# Patient Record
Sex: Female | Born: 1950
Health system: Southern US, Community
[De-identification: ages and names within clinical notes are randomized; demographics above are authoritative.]

## PROBLEM LIST (undated history)

## (undated) DIAGNOSIS — F419 Anxiety disorder, unspecified: Secondary | ICD-10-CM

## (undated) DIAGNOSIS — K469 Unspecified abdominal hernia without obstruction or gangrene: Secondary | ICD-10-CM

## (undated) DIAGNOSIS — M1990 Unspecified osteoarthritis, unspecified site: Secondary | ICD-10-CM

## (undated) DIAGNOSIS — J45909 Unspecified asthma, uncomplicated: Secondary | ICD-10-CM

## (undated) DIAGNOSIS — I10 Essential (primary) hypertension: Secondary | ICD-10-CM

## (undated) DIAGNOSIS — K219 Gastro-esophageal reflux disease without esophagitis: Secondary | ICD-10-CM

## (undated) DIAGNOSIS — F319 Bipolar disorder, unspecified: Secondary | ICD-10-CM

## (undated) DIAGNOSIS — Z86718 Personal history of other venous thrombosis and embolism: Secondary | ICD-10-CM

## (undated) DIAGNOSIS — Z87442 Personal history of urinary calculi: Secondary | ICD-10-CM

## (undated) DIAGNOSIS — IMO0001 Reserved for inherently not codable concepts without codable children: Secondary | ICD-10-CM

## (undated) HISTORY — PX: KNEE ARTHROPLASTY: SHX992

## (undated) HISTORY — DX: Unspecified abdominal hernia without obstruction or gangrene: K46.9

## (undated) HISTORY — PX: CHOLECYSTECTOMY: SHX55

## (undated) HISTORY — DX: Anxiety disorder, unspecified: F41.9

## (undated) HISTORY — DX: Unspecified asthma, uncomplicated: J45.909

---

## 2000-09-22 ENCOUNTER — Emergency Department (HOSPITAL_COMMUNITY): Admission: EM | Admit: 2000-09-22 | Discharge: 2000-09-22 | Payer: Self-pay | Admitting: *Deleted

## 2000-09-22 ENCOUNTER — Encounter: Payer: Self-pay | Admitting: Emergency Medicine

## 2000-09-23 ENCOUNTER — Ambulatory Visit (HOSPITAL_COMMUNITY): Admission: RE | Admit: 2000-09-23 | Discharge: 2000-09-23 | Payer: Self-pay | Admitting: Urology

## 2000-09-23 ENCOUNTER — Encounter: Payer: Self-pay | Admitting: Urology

## 2000-10-15 ENCOUNTER — Encounter: Payer: Self-pay | Admitting: Urology

## 2000-10-15 ENCOUNTER — Ambulatory Visit (HOSPITAL_COMMUNITY): Admission: RE | Admit: 2000-10-15 | Discharge: 2000-10-15 | Payer: Self-pay | Admitting: Urology

## 2000-10-21 ENCOUNTER — Encounter: Payer: Self-pay | Admitting: Urology

## 2000-10-21 ENCOUNTER — Ambulatory Visit (HOSPITAL_COMMUNITY): Admission: RE | Admit: 2000-10-21 | Discharge: 2000-10-21 | Payer: Self-pay | Admitting: Pulmonary Disease

## 2000-10-21 ENCOUNTER — Ambulatory Visit (HOSPITAL_COMMUNITY): Admission: RE | Admit: 2000-10-21 | Discharge: 2000-10-21 | Payer: Self-pay | Admitting: Urology

## 2000-11-07 ENCOUNTER — Encounter: Payer: Self-pay | Admitting: Urology

## 2000-11-07 ENCOUNTER — Ambulatory Visit (HOSPITAL_COMMUNITY): Admission: RE | Admit: 2000-11-07 | Discharge: 2000-11-07 | Payer: Self-pay | Admitting: Urology

## 2006-09-12 ENCOUNTER — Ambulatory Visit (HOSPITAL_COMMUNITY): Admission: RE | Admit: 2006-09-12 | Discharge: 2006-09-12 | Payer: Self-pay | Admitting: Pulmonary Disease

## 2008-09-01 ENCOUNTER — Ambulatory Visit (HOSPITAL_COMMUNITY): Admission: RE | Admit: 2008-09-01 | Discharge: 2008-09-01 | Payer: Self-pay | Admitting: Pulmonary Disease

## 2008-10-06 ENCOUNTER — Encounter: Admission: RE | Admit: 2008-10-06 | Discharge: 2008-10-06 | Payer: Self-pay | Admitting: Orthopedic Surgery

## 2008-10-20 ENCOUNTER — Encounter (HOSPITAL_COMMUNITY): Admission: RE | Admit: 2008-10-20 | Discharge: 2008-10-28 | Payer: Self-pay | Admitting: Orthopedic Surgery

## 2008-10-29 ENCOUNTER — Encounter (HOSPITAL_COMMUNITY): Admission: RE | Admit: 2008-10-29 | Discharge: 2008-11-28 | Payer: Self-pay | Admitting: Orthopedic Surgery

## 2008-11-25 ENCOUNTER — Inpatient Hospital Stay (HOSPITAL_COMMUNITY): Admission: RE | Admit: 2008-11-25 | Discharge: 2008-11-28 | Payer: Self-pay | Admitting: Orthopedic Surgery

## 2008-12-28 ENCOUNTER — Encounter (HOSPITAL_COMMUNITY): Admission: RE | Admit: 2008-12-28 | Discharge: 2009-01-26 | Payer: Self-pay | Admitting: Orthopedic Surgery

## 2009-10-01 ENCOUNTER — Encounter (INDEPENDENT_AMBULATORY_CARE_PROVIDER_SITE_OTHER): Payer: Self-pay | Admitting: Internal Medicine

## 2009-10-01 ENCOUNTER — Encounter: Payer: Self-pay | Admitting: Emergency Medicine

## 2009-10-01 ENCOUNTER — Inpatient Hospital Stay (HOSPITAL_COMMUNITY): Admission: EM | Admit: 2009-10-01 | Discharge: 2009-10-05 | Payer: Self-pay | Admitting: Internal Medicine

## 2009-10-21 ENCOUNTER — Inpatient Hospital Stay (HOSPITAL_COMMUNITY): Admission: EM | Admit: 2009-10-21 | Discharge: 2009-10-24 | Payer: Self-pay | Admitting: Emergency Medicine

## 2009-12-13 ENCOUNTER — Encounter (INDEPENDENT_AMBULATORY_CARE_PROVIDER_SITE_OTHER): Payer: Self-pay | Admitting: Family Medicine

## 2009-12-13 ENCOUNTER — Ambulatory Visit: Admission: RE | Admit: 2009-12-13 | Discharge: 2009-12-13 | Payer: Self-pay | Admitting: Family Medicine

## 2009-12-13 ENCOUNTER — Ambulatory Visit: Payer: Self-pay | Admitting: Vascular Surgery

## 2010-04-13 LAB — URINE MICROSCOPIC-ADD ON

## 2010-04-13 LAB — DIFFERENTIAL
Band Neutrophils: 0 % (ref 0–10)
Basophils Absolute: 0 10*3/uL (ref 0.0–0.1)
Basophils Absolute: 0 10*3/uL (ref 0.0–0.1)
Basophils Absolute: 0 10*3/uL (ref 0.0–0.1)
Basophils Relative: 1 % (ref 0–1)
Basophils Relative: 1 % (ref 0–1)
Basophils Relative: 0 % (ref 0–1)
Blasts: 0 %
Eosinophils Absolute: 0.2 10*3/uL (ref 0.0–0.7)
Eosinophils Absolute: 0.2 10*3/uL (ref 0.0–0.7)
Eosinophils Absolute: 0.1 10*3/uL (ref 0.0–0.7)
Eosinophils Relative: 3 % (ref 0–5)
Eosinophils Relative: 4 % (ref 0–5)
Eosinophils Relative: 1 % (ref 0–5)
Lymphocytes Relative: 17 % (ref 12–46)
Lymphocytes Relative: 27 % (ref 12–46)
Lymphocytes Relative: 28 % (ref 12–46)
Lymphs Abs: 1 10*3/uL (ref 0.7–4.0)
Lymphs Abs: 1.2 10*3/uL (ref 0.7–4.0)
Lymphs Abs: 1.6 10*3/uL (ref 0.7–4.0)
Metamyelocytes Relative: 0 %
Monocytes Absolute: 0.6 10*3/uL (ref 0.1–1.0)
Monocytes Absolute: 0.9 10*3/uL (ref 0.1–1.0)
Monocytes Absolute: 0.4 10*3/uL (ref 0.1–1.0)
Monocytes Relative: 14 % — ABNORMAL HIGH (ref 3–12)
Monocytes Relative: 15 % — ABNORMAL HIGH (ref 3–12)
Monocytes Relative: 7 % (ref 3–12)
Myelocytes: 0 %
Neutro Abs: 2.4 10*3/uL (ref 1.7–7.7)
Neutro Abs: 3.9 10*3/uL (ref 1.7–7.7)
Neutro Abs: 3.6 10*3/uL (ref 1.7–7.7)
Neutrophils Relative %: 55 % (ref 43–77)
Neutrophils Relative %: 65 % (ref 43–77)
Neutrophils Relative %: 64 % (ref 43–77)
Promyelocytes Absolute: 0 %
nRBC: 0 /100 WBC

## 2010-04-13 LAB — URINE CULTURE
Colony Count: >=100000
Colony Count: NO GROWTH
Colony Count: NO GROWTH
Culture  Setup Time: 201109232146
Culture  Setup Time: 201109242137
Culture  Setup Time: 201109041144
Culture: NO GROWTH
Culture: NO GROWTH
Special Requests: POSITIVE
Special Requests: NEGATIVE

## 2010-04-13 LAB — RAPID URINE DRUG SCREEN, HOSP PERFORMED
Amphetamines: NOT DETECTED
Amphetamines: NOT DETECTED
Barbiturates: NOT DETECTED
Barbiturates: NOT DETECTED
Benzodiazepines: NOT DETECTED
Benzodiazepines: NOT DETECTED
Cocaine: NOT DETECTED
Cocaine: NOT DETECTED
Opiates: NOT DETECTED
Opiates: NOT DETECTED
Tetrahydrocannabinol: NOT DETECTED
Tetrahydrocannabinol: NOT DETECTED

## 2010-04-13 LAB — BASIC METABOLIC PANEL
BUN: 5 mg/dL — ABNORMAL LOW (ref 6–23)
BUN: 8 mg/dL (ref 6–23)
BUN: 11 mg/dL (ref 6–23)
BUN: 13 mg/dL (ref 6–23)
BUN: 16 mg/dL (ref 6–23)
CO2: 27 mEq/L (ref 19–32)
CO2: 28 mEq/L (ref 19–32)
CO2: 26 mEq/L (ref 19–32)
CO2: 22 mEq/L (ref 19–32)
CO2: 24 mEq/L (ref 19–32)
Calcium: 8.3 mg/dL — ABNORMAL LOW (ref 8.4–10.5)
Calcium: 8.4 mg/dL (ref 8.4–10.5)
Calcium: 8.5 mg/dL (ref 8.4–10.5)
Calcium: 8.7 mg/dL (ref 8.4–10.5)
Calcium: 8.7 mg/dL (ref 8.4–10.5)
Chloride: 109 mEq/L (ref 96–112)
Chloride: 110 mEq/L (ref 96–112)
Chloride: 107 mEq/L (ref 96–112)
Chloride: 107 mEq/L (ref 96–112)
Chloride: 108 mEq/L (ref 96–112)
Creatinine, Ser: 0.6 mg/dL (ref 0.4–1.2)
Creatinine, Ser: 0.64 mg/dL (ref 0.4–1.2)
Creatinine, Ser: 0.91 mg/dL (ref 0.4–1.2)
Creatinine, Ser: 0.76 mg/dL (ref 0.4–1.2)
Creatinine, Ser: 0.81 mg/dL (ref 0.4–1.2)
GFR calc Af Amer: >60 mL/min (ref >60)
GFR calc Af Amer: >60 mL/min (ref >60)
GFR calc Af Amer: >60 mL/min (ref >60)
GFR calc Af Amer: >60 mL/min (ref >60)
GFR calc Af Amer: >60 mL/min (ref >60)
GFR calc non Af Amer: >60 mL/min (ref >60)
GFR calc non Af Amer: >60 mL/min (ref >60)
GFR calc non Af Amer: >60 mL/min (ref >60)
GFR calc non Af Amer: >60 mL/min (ref >60)
GFR calc non Af Amer: >60 mL/min (ref >60)
Glucose, Bld: 105 mg/dL — ABNORMAL HIGH (ref 70–99)
Glucose, Bld: 121 mg/dL — ABNORMAL HIGH (ref 70–99)
Glucose, Bld: 124 mg/dL — ABNORMAL HIGH (ref 70–99)
Glucose, Bld: 105 mg/dL — ABNORMAL HIGH (ref 70–99)
Glucose, Bld: 144 mg/dL — ABNORMAL HIGH (ref 70–99)
Potassium: 3.1 mEq/L — ABNORMAL LOW (ref 3.5–5.1)
Potassium: 3.5 mEq/L (ref 3.5–5.1)
Potassium: 3.4 mEq/L — ABNORMAL LOW (ref 3.5–5.1)
Potassium: 3.6 mEq/L (ref 3.5–5.1)
Potassium: 4.1 mEq/L (ref 3.5–5.1)
Sodium: 142 mEq/L (ref 135–145)
Sodium: 143 mEq/L (ref 135–145)
Sodium: 142 mEq/L (ref 135–145)
Sodium: 139 mEq/L (ref 135–145)
Sodium: 140 mEq/L (ref 135–145)

## 2010-04-13 LAB — CBC
HCT: 32.8 % — ABNORMAL LOW (ref 36.0–46.0)
HCT: 35.2 % — ABNORMAL LOW (ref 36.0–46.0)
HCT: 35.6 % — ABNORMAL LOW (ref 36.0–46.0)
HCT: 32.1 % — ABNORMAL LOW (ref 36.0–46.0)
HCT: 33.7 % — ABNORMAL LOW (ref 36.0–46.0)
HCT: 34.8 % — ABNORMAL LOW (ref 36.0–46.0)
HCT: 36.3 % (ref 36.0–46.0)
Hemoglobin: 10.7 g/dL — ABNORMAL LOW (ref 12.0–15.0)
Hemoglobin: 11.5 g/dL — ABNORMAL LOW (ref 12.0–15.0)
Hemoglobin: 11.6 g/dL — ABNORMAL LOW (ref 12.0–15.0)
Hemoglobin: 10.3 g/dL — ABNORMAL LOW (ref 12.0–15.0)
Hemoglobin: 10.6 g/dL — ABNORMAL LOW (ref 12.0–15.0)
Hemoglobin: 11.1 g/dL — ABNORMAL LOW (ref 12.0–15.0)
Hemoglobin: 11.4 g/dL — ABNORMAL LOW (ref 12.0–15.0)
MCH: 30.2 pg (ref 26.0–34.0)
MCH: 30.3 pg (ref 26.0–34.0)
MCH: 30.2 pg (ref 26.0–34.0)
MCH: 28.8 pg (ref 26.0–34.0)
MCH: 29.2 pg (ref 26.0–34.0)
MCH: 29.3 pg (ref 26.0–34.0)
MCH: 29.6 pg (ref 26.0–34.0)
MCHC: 32.5 g/dL (ref 30.0–36.0)
MCHC: 32.6 g/dL (ref 30.0–36.0)
MCHC: 32.7 g/dL (ref 30.0–36.0)
MCHC: 31.4 g/dL (ref 30.0–36.0)
MCHC: 31.5 g/dL (ref 30.0–36.0)
MCHC: 31.9 g/dL (ref 30.0–36.0)
MCHC: 32.1 g/dL (ref 30.0–36.0)
MCV: 92.9 fL (ref 78.0–100.0)
MCV: 92.9 fL (ref 78.0–100.0)
MCV: 92.2 fL (ref 78.0–100.0)
MCV: 90.9 fL (ref 78.0–100.0)
MCV: 91.6 fL (ref 78.0–100.0)
MCV: 92.8 fL (ref 78.0–100.0)
MCV: 93.3 fL (ref 78.0–100.0)
Platelets: 159 10*3/uL (ref 150–400)
Platelets: 161 10*3/uL (ref 150–400)
Platelets: 207 10*3/uL (ref 150–400)
Platelets: 227 10*3/uL (ref 150–400)
Platelets: 257 10*3/uL (ref 150–400)
Platelets: 277 10*3/uL (ref 150–400)
Platelets: 321 10*3/uL (ref 150–400)
RBC: 3.53 MIL/uL — ABNORMAL LOW (ref 3.87–5.11)
RBC: 3.79 MIL/uL — ABNORMAL LOW (ref 3.87–5.11)
RBC: 3.86 MIL/uL — ABNORMAL LOW (ref 3.87–5.11)
RBC: 3.53 MIL/uL — ABNORMAL LOW (ref 3.87–5.11)
RBC: 3.68 MIL/uL — ABNORMAL LOW (ref 3.87–5.11)
RBC: 3.75 MIL/uL — ABNORMAL LOW (ref 3.87–5.11)
RBC: 3.89 MIL/uL (ref 3.87–5.11)
RDW: 14.8 % (ref 11.5–15.5)
RDW: 14.8 % (ref 11.5–15.5)
RDW: 14.4 % (ref 11.5–15.5)
RDW: 14 % (ref 11.5–15.5)
RDW: 14 % (ref 11.5–15.5)
RDW: 14.1 % (ref 11.5–15.5)
RDW: 14.2 % (ref 11.5–15.5)
WBC: 4.4 10*3/uL (ref 4.0–10.5)
WBC: 5.9 10*3/uL (ref 4.0–10.5)
WBC: 5.7 10*3/uL (ref 4.0–10.5)
WBC: 5.6 10*3/uL (ref 4.0–10.5)
WBC: 7.1 10*3/uL (ref 4.0–10.5)
WBC: 7.3 10*3/uL (ref 4.0–10.5)
WBC: 8 10*3/uL (ref 4.0–10.5)

## 2010-04-13 LAB — COMPREHENSIVE METABOLIC PANEL
ALT: 27 U/L (ref 0–35)
ALT: 14 U/L (ref 0–35)
AST: 25 U/L (ref 0–37)
AST: 18 U/L (ref 0–37)
Albumin: 3.6 g/dL (ref 3.5–5.2)
Albumin: 2.9 g/dL — ABNORMAL LOW (ref 3.5–5.2)
Alkaline Phosphatase: 55 U/L (ref 39–117)
Alkaline Phosphatase: 50 U/L (ref 39–117)
BUN: 10 mg/dL (ref 6–23)
BUN: 13 mg/dL (ref 6–23)
CO2: 30 mEq/L (ref 19–32)
CO2: 24 mEq/L (ref 19–32)
Calcium: 8.8 mg/dL (ref 8.4–10.5)
Calcium: 8.7 mg/dL (ref 8.4–10.5)
Chloride: 100 mEq/L (ref 96–112)
Chloride: 110 mEq/L (ref 96–112)
Creatinine, Ser: 0.76 mg/dL (ref 0.4–1.2)
Creatinine, Ser: 0.74 mg/dL (ref 0.4–1.2)
GFR calc Af Amer: >60 mL/min (ref >60)
GFR calc Af Amer: >60 mL/min (ref >60)
GFR calc non Af Amer: >60 mL/min (ref >60)
GFR calc non Af Amer: >60 mL/min (ref >60)
Glucose, Bld: 108 mg/dL — ABNORMAL HIGH (ref 70–99)
Glucose, Bld: 114 mg/dL — ABNORMAL HIGH (ref 70–99)
Potassium: 3.5 mEq/L (ref 3.5–5.1)
Potassium: 3.4 mEq/L — ABNORMAL LOW (ref 3.5–5.1)
Sodium: 138 mEq/L (ref 135–145)
Sodium: 142 mEq/L (ref 135–145)
Total Bilirubin: 0.7 mg/dL (ref 0.3–1.2)
Total Bilirubin: 0.5 mg/dL (ref 0.3–1.2)
Total Protein: 6.6 g/dL (ref 6.0–8.3)
Total Protein: 6.5 g/dL (ref 6.0–8.3)

## 2010-04-13 LAB — PROTIME-INR
INR: 2.18 — ABNORMAL HIGH (ref 0.00–1.49)
INR: 2.23 — ABNORMAL HIGH (ref 0.00–1.49)
INR: 2.32 — ABNORMAL HIGH (ref 0.00–1.49)
INR: 2.33 — ABNORMAL HIGH (ref 0.00–1.49)
INR: 1.16 (ref 0.00–1.49)
INR: 1.19 (ref 0.00–1.49)
INR: 1.3 (ref 0.00–1.49)
INR: 1.83 — ABNORMAL HIGH (ref 0.00–1.49)
INR: 2.2 — ABNORMAL HIGH (ref 0.00–1.49)
Prothrombin Time: 24.4 seconds — ABNORMAL HIGH (ref 11.6–15.2)
Prothrombin Time: 24.8 seconds — ABNORMAL HIGH (ref 11.6–15.2)
Prothrombin Time: 25.6 seconds — ABNORMAL HIGH (ref 11.6–15.2)
Prothrombin Time: 25.7 seconds — ABNORMAL HIGH (ref 11.6–15.2)
Prothrombin Time: 15 seconds (ref 11.6–15.2)
Prothrombin Time: 15.3 seconds — ABNORMAL HIGH (ref 11.6–15.2)
Prothrombin Time: 16.4 seconds — ABNORMAL HIGH (ref 11.6–15.2)
Prothrombin Time: 21.3 seconds — ABNORMAL HIGH (ref 11.6–15.2)
Prothrombin Time: 24.6 seconds — ABNORMAL HIGH (ref 11.6–15.2)

## 2010-04-13 LAB — LIPID PANEL
Cholesterol: 165 mg/dL (ref 0–200)
Cholesterol: 161 mg/dL (ref 0–200)
HDL: 46 mg/dL (ref >39)
HDL: 37 mg/dL — ABNORMAL LOW (ref >39)
LDL Cholesterol: 99 mg/dL (ref 0–99)
LDL Cholesterol: 106 mg/dL — ABNORMAL HIGH (ref 0–99)
Total CHOL/HDL Ratio: 3.6 RATIO
Total CHOL/HDL Ratio: 4.4 RATIO
Triglycerides: 100 mg/dL (ref <150)
Triglycerides: 88 mg/dL (ref <150)
VLDL: 20 mg/dL (ref 0–40)
VLDL: 18 mg/dL (ref 0–40)

## 2010-04-13 LAB — CK TOTAL AND CKMB (NOT AT ARMC)
CK, MB: 3.3 ng/mL (ref 0.3–4.0)
Relative Index: 4.6 — ABNORMAL HIGH (ref 0.0–2.5)
Total CK: 132 U/L (ref 7–177)

## 2010-04-13 LAB — BLOOD GAS, ARTERIAL
Acid-Base Excess: 3.5 mmol/L — ABNORMAL HIGH (ref 0.0–2.0)
Bicarbonate: 27.3 mEq/L — ABNORMAL HIGH (ref 20.0–24.0)
FIO2: 21 %
O2 Saturation: 96.2 %
Patient temperature: 37
TCO2: 24.8 mmol/L (ref 0–100)
pCO2 arterial: 40.4 mmHg (ref 35.0–45.0)
pH, Arterial: 7.445 — ABNORMAL HIGH (ref 7.350–7.400)
pO2, Arterial: 75.4 mmHg — ABNORMAL LOW (ref 80.0–100.0)

## 2010-04-13 LAB — URINALYSIS, ROUTINE W REFLEX MICROSCOPIC
Bilirubin Urine: NEGATIVE
Bilirubin Urine: NEGATIVE
Glucose, UA: NEGATIVE mg/dL
Glucose, UA: NEGATIVE mg/dL
Glucose, UA: NEGATIVE mg/dL
Ketones, ur: NEGATIVE mg/dL
Ketones, ur: NEGATIVE mg/dL
Ketones, ur: NEGATIVE mg/dL
Leukocytes, UA: NEGATIVE
Leukocytes, UA: NEGATIVE
Nitrite: NEGATIVE
Nitrite: NEGATIVE
Nitrite: NEGATIVE
Protein, ur: NEGATIVE mg/dL
Protein, ur: NEGATIVE mg/dL
Protein, ur: 30 mg/dL — AB
Specific Gravity, Urine: 1.01 (ref 1.005–1.030)
Specific Gravity, Urine: <1.005 — ABNORMAL LOW (ref 1.005–1.030)
Specific Gravity, Urine: 1.01 (ref 1.005–1.030)
Urobilinogen, UA: 0.2 mg/dL (ref 0.0–1.0)
Urobilinogen, UA: 0.2 mg/dL (ref 0.0–1.0)
Urobilinogen, UA: 1 mg/dL (ref 0.0–1.0)
pH: 6.5 (ref 5.0–8.0)
pH: 7 (ref 5.0–8.0)
pH: 6 (ref 5.0–8.0)

## 2010-04-13 LAB — GLUCOSE, CAPILLARY
Glucose-Capillary: 125 mg/dL — ABNORMAL HIGH (ref 70–99)
Glucose-Capillary: 108 mg/dL — ABNORMAL HIGH (ref 70–99)
Glucose-Capillary: 109 mg/dL — ABNORMAL HIGH (ref 70–99)
Glucose-Capillary: 115 mg/dL — ABNORMAL HIGH (ref 70–99)
Glucose-Capillary: 123 mg/dL — ABNORMAL HIGH (ref 70–99)
Glucose-Capillary: 126 mg/dL — ABNORMAL HIGH (ref 70–99)
Glucose-Capillary: 153 mg/dL — ABNORMAL HIGH (ref 70–99)
Glucose-Capillary: 192 mg/dL — ABNORMAL HIGH (ref 70–99)
Glucose-Capillary: 96 mg/dL (ref 70–99)

## 2010-04-13 LAB — CARDIAC PANEL(CRET KIN+CKTOT+MB+TROPI)
CK, MB: 2 ng/mL (ref 0.3–4.0)
CK, MB: 2.4 ng/mL (ref 0.3–4.0)
CK, MB: 2.5 ng/mL (ref 0.3–4.0)
CK, MB: 2.6 ng/mL (ref 0.3–4.0)
Relative Index: 1.7 (ref 0.0–2.5)
Relative Index: 1.8 (ref 0.0–2.5)
Relative Index: 1.9 (ref 0.0–2.5)
Relative Index: 2 (ref 0.0–2.5)
Total CK: 118 U/L (ref 7–177)
Total CK: 125 U/L (ref 7–177)
Total CK: 130 U/L (ref 7–177)
Total CK: 139 U/L (ref 7–177)
Troponin I: 0.01 ng/mL (ref 0.00–0.06)
Troponin I: 0.04 ng/mL (ref 0.00–0.06)
Troponin I: 0.06 ng/mL (ref 0.00–0.06)
Troponin I: 0.08 ng/mL — ABNORMAL HIGH (ref 0.00–0.06)

## 2010-04-13 LAB — TROPONIN I: Troponin I: 0.1 ng/mL — ABNORMAL HIGH (ref 0.00–0.06)

## 2010-04-13 LAB — HEMOGLOBIN A1C
Hgb A1c MFr Bld: 6.4 % — ABNORMAL HIGH (ref <5.7)
Hgb A1c MFr Bld: 5.9 % — ABNORMAL HIGH (ref <5.7)
Mean Plasma Glucose: 137 mg/dL — ABNORMAL HIGH (ref <117)
Mean Plasma Glucose: 123 mg/dL — ABNORMAL HIGH (ref <117)

## 2010-04-13 LAB — ETHANOL: Alcohol, Ethyl (B): <5 mg/dL (ref 0–10)

## 2010-04-13 LAB — HOMOCYSTEINE: Homocysteine: 9.5 umol/L (ref 4.0–15.4)

## 2010-04-13 LAB — LACTIC ACID, PLASMA: Lactic Acid, Venous: 1.2 mmol/L (ref 0.5–2.2)

## 2010-04-13 LAB — BRAIN NATRIURETIC PEPTIDE
Pro B Natriuretic peptide (BNP): 442 pg/mL — ABNORMAL HIGH (ref 0.0–100.0)
Pro B Natriuretic peptide (BNP): 290 pg/mL — ABNORMAL HIGH (ref 0.0–100.0)

## 2010-04-13 LAB — APTT
aPTT: 32 seconds (ref 24–37)
aPTT: 36 seconds (ref 24–37)
aPTT: 39 seconds — ABNORMAL HIGH (ref 24–37)
aPTT: 33 seconds (ref 24–37)

## 2010-04-13 LAB — PROCALCITONIN: Procalcitonin: <0.1 ng/mL

## 2010-04-13 LAB — AMMONIA: Ammonia: <8 umol/L — ABNORMAL LOW (ref 11–35)

## 2010-04-13 LAB — HEPARIN LEVEL (UNFRACTIONATED)
Heparin Unfractionated: 0.63 IU/mL (ref 0.30–0.70)
Heparin Unfractionated: 1.01 IU/mL — ABNORMAL HIGH (ref 0.30–0.70)
Heparin Unfractionated: 1.08 IU/mL — ABNORMAL HIGH (ref 0.30–0.70)

## 2010-04-13 LAB — MRSA PCR SCREENING: MRSA by PCR: NEGATIVE

## 2010-05-04 LAB — CBC
HCT: 28.3 % — ABNORMAL LOW (ref 36.0–46.0)
HCT: 29 % — ABNORMAL LOW (ref 36.0–46.0)
HCT: 36.8 % (ref 36.0–46.0)
Hemoglobin: 12.3 g/dL (ref 12.0–15.0)
Hemoglobin: 9.4 g/dL — ABNORMAL LOW (ref 12.0–15.0)
Hemoglobin: 9.8 g/dL — ABNORMAL LOW (ref 12.0–15.0)
MCHC: 33.3 g/dL (ref 30.0–36.0)
MCHC: 33.3 g/dL (ref 30.0–36.0)
MCHC: 33.8 g/dL (ref 30.0–36.0)
MCV: 93.8 fL (ref 78.0–100.0)
MCV: 94.1 fL (ref 78.0–100.0)
MCV: 94.3 fL (ref 78.0–100.0)
Platelets: 143 10*3/uL — ABNORMAL LOW (ref 150–400)
Platelets: 154 10*3/uL (ref 150–400)
Platelets: 242 10*3/uL (ref 150–400)
RBC: 3 MIL/uL — ABNORMAL LOW (ref 3.87–5.11)
RBC: 3.08 MIL/uL — ABNORMAL LOW (ref 3.87–5.11)
RBC: 3.93 MIL/uL (ref 3.87–5.11)
RDW: 14 % (ref 11.5–15.5)
RDW: 14.1 % (ref 11.5–15.5)
RDW: 14.3 % (ref 11.5–15.5)
WBC: 10.2 10*3/uL (ref 4.0–10.5)
WBC: 11.7 10*3/uL — ABNORMAL HIGH (ref 4.0–10.5)
WBC: 8.8 10*3/uL (ref 4.0–10.5)

## 2010-05-04 LAB — COMPREHENSIVE METABOLIC PANEL
ALT: 15 U/L (ref 0–35)
AST: 15 U/L (ref 0–37)
Albumin: 3.8 g/dL (ref 3.5–5.2)
Alkaline Phosphatase: 69 U/L (ref 39–117)
BUN: 18 mg/dL (ref 6–23)
CO2: 24 mEq/L (ref 19–32)
Calcium: 8.9 mg/dL (ref 8.4–10.5)
Chloride: 106 mEq/L (ref 96–112)
Creatinine, Ser: 0.97 mg/dL (ref 0.4–1.2)
GFR calc Af Amer: >60 mL/min (ref >60)
GFR calc non Af Amer: 59 mL/min — ABNORMAL LOW (ref >60)
Glucose, Bld: 90 mg/dL (ref 70–99)
Potassium: 4 mEq/L (ref 3.5–5.1)
Sodium: 137 mEq/L (ref 135–145)
Total Bilirubin: 0.4 mg/dL (ref 0.3–1.2)
Total Protein: 7.2 g/dL (ref 6.0–8.3)

## 2010-05-04 LAB — URINALYSIS, ROUTINE W REFLEX MICROSCOPIC
Glucose, UA: NEGATIVE mg/dL
Hgb urine dipstick: NEGATIVE
Ketones, ur: NEGATIVE mg/dL
Nitrite: NEGATIVE
Protein, ur: NEGATIVE mg/dL
Specific Gravity, Urine: 1.013 (ref 1.005–1.030)
Urobilinogen, UA: 0.2 mg/dL (ref 0.0–1.0)
pH: 5.5 (ref 5.0–8.0)

## 2010-05-04 LAB — APTT: aPTT: 22 seconds — ABNORMAL LOW (ref 24–37)

## 2010-05-04 LAB — TYPE AND SCREEN
ABO/RH(D): O POS
Antibody Screen: NEGATIVE

## 2010-05-04 LAB — ABO/RH: ABO/RH(D): O POS

## 2010-05-04 LAB — PROTIME-INR
INR: 1.11 (ref 0.00–1.49)
INR: 1.23 (ref 0.00–1.49)
INR: 1.45 (ref 0.00–1.49)
INR: 1.66 — ABNORMAL HIGH (ref 0.00–1.49)
Prothrombin Time: 14.2 seconds (ref 11.6–15.2)
Prothrombin Time: 15.4 seconds — ABNORMAL HIGH (ref 11.6–15.2)
Prothrombin Time: 17.5 seconds — ABNORMAL HIGH (ref 11.6–15.2)
Prothrombin Time: 19.5 seconds — ABNORMAL HIGH (ref 11.6–15.2)

## 2010-07-01 ENCOUNTER — Emergency Department (HOSPITAL_COMMUNITY)
Admission: EM | Admit: 2010-07-01 | Discharge: 2010-07-02 | Disposition: A | Discharge status: Home / Self Care | Payer: Medicaid Other | Attending: Emergency Medicine | Admitting: Emergency Medicine

## 2010-07-01 ENCOUNTER — Emergency Department (HOSPITAL_COMMUNITY): Payer: Medicaid Other

## 2010-07-01 DIAGNOSIS — F29 Unspecified psychosis not due to a substance or known physiological condition: Secondary | ICD-10-CM | POA: Insufficient documentation

## 2010-07-01 DIAGNOSIS — I10 Essential (primary) hypertension: Secondary | ICD-10-CM | POA: Insufficient documentation

## 2010-07-01 DIAGNOSIS — Z86711 Personal history of pulmonary embolism: Secondary | ICD-10-CM | POA: Insufficient documentation

## 2010-07-01 DIAGNOSIS — N390 Urinary tract infection, site not specified: Secondary | ICD-10-CM | POA: Insufficient documentation

## 2010-07-01 DIAGNOSIS — R55 Syncope and collapse: Secondary | ICD-10-CM | POA: Insufficient documentation

## 2010-07-01 LAB — URINE MICROSCOPIC-ADD ON

## 2010-07-01 LAB — BASIC METABOLIC PANEL
BUN: 12 mg/dL (ref 6–23)
CO2: 28 mEq/L (ref 19–32)
Calcium: 9.6 mg/dL (ref 8.4–10.5)
Chloride: 104 mEq/L (ref 96–112)
Creatinine, Ser: 0.69 mg/dL (ref 0.4–1.2)
GFR calc Af Amer: >60 mL/min (ref >60)
GFR calc non Af Amer: >60 mL/min (ref >60)
Glucose, Bld: 115 mg/dL — ABNORMAL HIGH (ref 70–99)
Potassium: 3.2 mEq/L — ABNORMAL LOW (ref 3.5–5.1)
Sodium: 140 mEq/L (ref 135–145)

## 2010-07-01 LAB — DIFFERENTIAL
Basophils Absolute: 0 10*3/uL (ref 0.0–0.1)
Basophils Relative: 1 % (ref 0–1)
Eosinophils Absolute: 0.1 10*3/uL (ref 0.0–0.7)
Eosinophils Relative: 2 % (ref 0–5)
Lymphocytes Relative: 24 % (ref 12–46)
Lymphs Abs: 1.1 10*3/uL (ref 0.7–4.0)
Monocytes Absolute: 0.5 10*3/uL (ref 0.1–1.0)
Monocytes Relative: 11 % (ref 3–12)
Neutro Abs: 3 10*3/uL (ref 1.7–7.7)
Neutrophils Relative %: 63 % (ref 43–77)

## 2010-07-01 LAB — CBC
HCT: 34.9 % — ABNORMAL LOW (ref 36.0–46.0)
Hemoglobin: 11 g/dL — ABNORMAL LOW (ref 12.0–15.0)
MCH: 28.7 pg (ref 26.0–34.0)
MCHC: 31.5 g/dL (ref 30.0–36.0)
MCV: 91.1 fL (ref 78.0–100.0)
Platelets: 228 10*3/uL (ref 150–400)
RBC: 3.83 MIL/uL — ABNORMAL LOW (ref 3.87–5.11)
RDW: 13.7 % (ref 11.5–15.5)
WBC: 4.8 10*3/uL (ref 4.0–10.5)

## 2010-07-01 LAB — URINALYSIS, ROUTINE W REFLEX MICROSCOPIC
Bilirubin Urine: NEGATIVE
Glucose, UA: NEGATIVE mg/dL
Ketones, ur: NEGATIVE mg/dL
Leukocytes, UA: NEGATIVE
Nitrite: NEGATIVE
Protein, ur: NEGATIVE mg/dL
Specific Gravity, Urine: 1.015 (ref 1.005–1.030)
Urobilinogen, UA: 0.2 mg/dL (ref 0.0–1.0)
pH: 6 (ref 5.0–8.0)

## 2010-07-01 LAB — PRO B NATRIURETIC PEPTIDE: Pro B Natriuretic peptide (BNP): 41.7 pg/mL (ref 0–125)

## 2010-07-01 LAB — D-DIMER, QUANTITATIVE: D-Dimer, Quant: 3.16 ug/mL-FEU — ABNORMAL HIGH (ref 0.00–0.48)

## 2010-07-01 MED ORDER — IOHEXOL 350 MG/ML SOLN
100.0000 mL | Freq: Once | INTRAVENOUS | Status: AC | PRN
  Administered 2010-07-01: 100 mL via INTRAVENOUS

## 2010-07-04 LAB — URINE CULTURE: Culture  Setup Time: 201206032135

## 2010-07-08 ENCOUNTER — Emergency Department (HOSPITAL_COMMUNITY)
Admission: EM | Admit: 2010-07-08 | Discharge: 2010-07-12 | Disposition: A | Discharge status: Other Acute Inpatient Hospital | Payer: Medicaid Other | Attending: Emergency Medicine | Admitting: Emergency Medicine

## 2010-07-08 DIAGNOSIS — F29 Unspecified psychosis not due to a substance or known physiological condition: Secondary | ICD-10-CM | POA: Insufficient documentation

## 2010-07-08 DIAGNOSIS — M79609 Pain in unspecified limb: Secondary | ICD-10-CM | POA: Insufficient documentation

## 2010-07-08 DIAGNOSIS — I10 Essential (primary) hypertension: Secondary | ICD-10-CM | POA: Insufficient documentation

## 2010-07-08 DIAGNOSIS — Z96659 Presence of unspecified artificial knee joint: Secondary | ICD-10-CM | POA: Insufficient documentation

## 2010-07-08 DIAGNOSIS — Z86711 Personal history of pulmonary embolism: Secondary | ICD-10-CM | POA: Insufficient documentation

## 2010-07-08 DIAGNOSIS — F319 Bipolar disorder, unspecified: Secondary | ICD-10-CM | POA: Insufficient documentation

## 2010-07-08 DIAGNOSIS — M7989 Other specified soft tissue disorders: Secondary | ICD-10-CM | POA: Insufficient documentation

## 2010-07-08 LAB — BASIC METABOLIC PANEL
BUN: 13 mg/dL (ref 6–23)
CO2: 28 mEq/L (ref 19–32)
Calcium: 9.8 mg/dL (ref 8.4–10.5)
Chloride: 102 mEq/L (ref 96–112)
Creatinine, Ser: 0.61 mg/dL (ref 0.4–1.2)
GFR calc Af Amer: >60 mL/min (ref >60)
GFR calc non Af Amer: >60 mL/min (ref >60)
Glucose, Bld: 103 mg/dL — ABNORMAL HIGH (ref 70–99)
Potassium: 3.8 mEq/L (ref 3.5–5.1)
Sodium: 139 mEq/L (ref 135–145)

## 2010-07-08 LAB — URINE MICROSCOPIC-ADD ON

## 2010-07-08 LAB — CBC
HCT: 35.3 % — ABNORMAL LOW (ref 36.0–46.0)
Hemoglobin: 11.2 g/dL — ABNORMAL LOW (ref 12.0–15.0)
MCH: 29.1 pg (ref 26.0–34.0)
MCHC: 31.7 g/dL (ref 30.0–36.0)
MCV: 91.7 fL (ref 78.0–100.0)
Platelets: 246 10*3/uL (ref 150–400)
RBC: 3.85 MIL/uL — ABNORMAL LOW (ref 3.87–5.11)
RDW: 14.1 % (ref 11.5–15.5)
WBC: 4.8 10*3/uL (ref 4.0–10.5)

## 2010-07-08 LAB — DIFFERENTIAL
Basophils Absolute: 0 10*3/uL (ref 0.0–0.1)
Basophils Relative: 1 % (ref 0–1)
Eosinophils Absolute: 0.1 10*3/uL (ref 0.0–0.7)
Eosinophils Relative: 3 % (ref 0–5)
Lymphocytes Relative: 36 % (ref 12–46)
Lymphs Abs: 1.7 10*3/uL (ref 0.7–4.0)
Monocytes Absolute: 0.6 10*3/uL (ref 0.1–1.0)
Monocytes Relative: 12 % (ref 3–12)
Neutro Abs: 2.3 10*3/uL (ref 1.7–7.7)
Neutrophils Relative %: 49 % (ref 43–77)

## 2010-07-08 LAB — RAPID URINE DRUG SCREEN, HOSP PERFORMED
Amphetamines: NOT DETECTED
Barbiturates: NOT DETECTED
Benzodiazepines: NOT DETECTED
Cocaine: NOT DETECTED
Opiates: NOT DETECTED
Tetrahydrocannabinol: NOT DETECTED

## 2010-07-08 LAB — URINALYSIS, ROUTINE W REFLEX MICROSCOPIC
Bilirubin Urine: NEGATIVE
Glucose, UA: NEGATIVE mg/dL
Ketones, ur: NEGATIVE mg/dL
Leukocytes, UA: NEGATIVE
Nitrite: NEGATIVE
Protein, ur: NEGATIVE mg/dL
Specific Gravity, Urine: 1.01 (ref 1.005–1.030)
Urobilinogen, UA: 0.2 mg/dL (ref 0.0–1.0)
pH: 6.5 (ref 5.0–8.0)

## 2010-07-08 LAB — ETHANOL: Alcohol, Ethyl (B): <11 mg/dL — ABNORMAL HIGH (ref 0–10)

## 2010-07-09 ENCOUNTER — Emergency Department (HOSPITAL_COMMUNITY): Payer: Medicaid Other

## 2010-07-09 LAB — PROTIME-INR
INR: 1.04 (ref 0.00–1.49)
Prothrombin Time: 13.8 seconds (ref 11.6–15.2)

## 2010-07-09 LAB — APTT: aPTT: 28 seconds (ref 24–37)

## 2010-07-10 LAB — PROTIME-INR
INR: 0.98 (ref 0.00–1.49)
Prothrombin Time: 13.2 seconds (ref 11.6–15.2)

## 2010-07-11 LAB — PROTIME-INR
INR: 1.12 (ref 0.00–1.49)
Prothrombin Time: 14.6 seconds (ref 11.6–15.2)

## 2010-08-04 ENCOUNTER — Other Ambulatory Visit: Payer: Self-pay | Admitting: Orthopedic Surgery

## 2010-08-04 ENCOUNTER — Ambulatory Visit
Admission: RE | Admit: 2010-08-04 | Discharge: 2010-08-04 | Disposition: A | Discharge status: Home / Self Care | Payer: Medicaid Other | Source: Ambulatory Visit | Attending: Orthopedic Surgery | Admitting: Orthopedic Surgery

## 2010-08-04 DIAGNOSIS — S86819A Strain of other muscle(s) and tendon(s) at lower leg level, unspecified leg, initial encounter: Secondary | ICD-10-CM

## 2010-08-07 ENCOUNTER — Ambulatory Visit (HOSPITAL_COMMUNITY): Payer: Medicaid Other

## 2010-08-07 ENCOUNTER — Inpatient Hospital Stay (HOSPITAL_COMMUNITY)
Admission: RE | Admit: 2010-08-07 | Discharge: 2010-08-08 | DRG: 502 | Disposition: A | Discharge status: Home / Self Care | Payer: Medicaid Other | Source: Ambulatory Visit | Attending: Orthopedic Surgery | Admitting: Orthopedic Surgery

## 2010-08-07 DIAGNOSIS — X500XXA Overexertion from strenuous movement or load, initial encounter: Secondary | ICD-10-CM | POA: Diagnosis present

## 2010-08-07 DIAGNOSIS — I10 Essential (primary) hypertension: Secondary | ICD-10-CM | POA: Diagnosis present

## 2010-08-07 DIAGNOSIS — M1990 Unspecified osteoarthritis, unspecified site: Secondary | ICD-10-CM | POA: Diagnosis present

## 2010-08-07 DIAGNOSIS — Z86718 Personal history of other venous thrombosis and embolism: Secondary | ICD-10-CM

## 2010-08-07 DIAGNOSIS — T8489XA Other specified complication of internal orthopedic prosthetic devices, implants and grafts, initial encounter: Principal | ICD-10-CM | POA: Diagnosis present

## 2010-08-07 DIAGNOSIS — S838X9A Sprain of other specified parts of unspecified knee, initial encounter: Secondary | ICD-10-CM | POA: Diagnosis present

## 2010-08-07 DIAGNOSIS — E669 Obesity, unspecified: Secondary | ICD-10-CM | POA: Diagnosis present

## 2010-08-07 DIAGNOSIS — Z96659 Presence of unspecified artificial knee joint: Secondary | ICD-10-CM

## 2010-08-07 DIAGNOSIS — Y831 Surgical operation with implant of artificial internal device as the cause of abnormal reaction of the patient, or of later complication, without mention of misadventure at the time of the procedure: Secondary | ICD-10-CM | POA: Diagnosis present

## 2010-08-07 DIAGNOSIS — G4730 Sleep apnea, unspecified: Secondary | ICD-10-CM

## 2010-08-07 LAB — SURGICAL PCR SCREEN
MRSA, PCR: NEGATIVE
Staphylococcus aureus: NEGATIVE

## 2010-08-07 LAB — COMPREHENSIVE METABOLIC PANEL
ALT: 14 U/L (ref 0–35)
AST: 15 U/L (ref 0–37)
Albumin: 3.3 g/dL — ABNORMAL LOW (ref 3.5–5.2)
Alkaline Phosphatase: 67 U/L (ref 39–117)
BUN: 10 mg/dL (ref 6–23)
CO2: 27 mEq/L (ref 19–32)
Calcium: 8.8 mg/dL (ref 8.4–10.5)
Chloride: 110 mEq/L (ref 96–112)
Creatinine, Ser: 0.55 mg/dL (ref 0.50–1.10)
GFR calc Af Amer: >60 mL/min (ref >60)
GFR calc non Af Amer: >60 mL/min (ref >60)
Glucose, Bld: 94 mg/dL (ref 70–99)
Potassium: 3.5 mEq/L (ref 3.5–5.1)
Sodium: 146 mEq/L — ABNORMAL HIGH (ref 135–145)
Total Bilirubin: 0.4 mg/dL (ref 0.3–1.2)
Total Protein: 7.1 g/dL (ref 6.0–8.3)

## 2010-08-07 LAB — CBC
HCT: 35 % — ABNORMAL LOW (ref 36.0–46.0)
Hemoglobin: 11.3 g/dL — ABNORMAL LOW (ref 12.0–15.0)
MCH: 29.4 pg (ref 26.0–34.0)
MCHC: 32.3 g/dL (ref 30.0–36.0)
MCV: 91.1 fL (ref 78.0–100.0)
Platelets: 310 10*3/uL (ref 150–400)
RBC: 3.84 MIL/uL — ABNORMAL LOW (ref 3.87–5.11)
RDW: 13.4 % (ref 11.5–15.5)
WBC: 5.6 10*3/uL (ref 4.0–10.5)

## 2010-08-07 LAB — TYPE AND SCREEN
ABO/RH(D): O POS
Antibody Screen: NEGATIVE

## 2010-08-07 LAB — PROTIME-INR
INR: 1.32 (ref 0.00–1.49)
Prothrombin Time: 16.6 seconds — ABNORMAL HIGH (ref 11.6–15.2)

## 2010-08-07 LAB — APTT: aPTT: 26 seconds (ref 24–37)

## 2010-08-08 LAB — PROTIME-INR
INR: 1.22 (ref 0.00–1.49)
Prothrombin Time: 15.7 seconds — ABNORMAL HIGH (ref 11.6–15.2)

## 2010-08-09 NOTE — Discharge Summary (Signed)
  Margaret Bishop, Margaret NO.:  [id number]  MEDICAL RECORD NO.:  [id number]  LOCATION:  5019                         FACILITY:  [facility]  PHYSICIAN:  Myrtie Neither, MD      DATE OF BIRTH:  1950/05/08  DATE OF ADMISSION:  08/07/2010 DATE OF DISCHARGE:  08/08/2010                              DISCHARGE SUMMARY   ADMITTING DIAGNOSES: 1. Patellar tendon rupture of left total knee. 2. Subluxing patella, left knee. 3. History of hypertension. 4. History of pulmonary embolus.  DISCHARGE DIAGNOSES: 1. Patellar tendon rupture of left total knee. 2. Subluxing patella, left knee. 3. History of hypertension. 4. History of pulmonary embolus.  COMPLICATIONS:  None.  INFECTIONS:  None.  OPERATION:  Patellar tendon repair, left knee and lateral release, left knee.  PERTINENT HISTORY:  This is a 60 year old female with a history of left total knee in 2010.  The patient was doing fairly well up until 2 weeks ago and states she has developed severe onset of pain, swelling, and popping in the left knee and inability to fully extend the left knee. The patient was initially seen by Dr. Renaye Rakers and referred to the office.  The patient's pertinent physical was that of the left knee, +2 effusion, tender, possible defect, mid substances of the patellar tendon, inability to fully extend the left knee with subluxing patella.  HOSPITAL COURSE:  The patient underwent a CBC, EKG, chest x-ray, PT, PTT, CMET,  INR CMET, UA, and chest x-ray.  The patient's labs were found to be stable to undergo surgery.  The patient underwent patellar tendon repair as well as lateral release of left patella.  She tolerated the procedure quite well.  Postop course pain was brought under control, ice packs, elevation, physical therapy, partial weightbearing on the left side.  The patient will be started on Coumadin and stable enough to be discharged, Percocet 1-2 q.6. p.r.n. for pain, ice packs with  elevation, Home Health physical therapy and Home Health nurse for regulating Coumadin therapy with INRs. The patient will return to the office in 1 week.  The patient will be discharged in stable and satisfactory condition.     Myrtie Neither, MD     AC/MEDQ  D:  08/08/2010  T:  08/09/2010  Job:  [id number]  Electronically Signed by Myrtie Neither MD on 08/09/2010 12:28:06 PM

## 2010-08-09 NOTE — Op Note (Signed)
  NAMETAKERIA, MARQUINA NO.:  [id number]  MEDICAL RECORD NO.:  [id number]  LOCATION:  5019                         FACILITY:  [facility]  PHYSICIAN:  Myrtie Neither, MD      DATE OF BIRTH:  1950-08-06  DATE OF PROCEDURE:  08/07/2010 DATE OF DISCHARGE:                              OPERATIVE REPORT   PREOPERATIVE DIAGNOSES: 1. Patellar tendon rupture, left total knee. 2. Subluxing patella left total knee.  ANESTHESIA:  General.  PROCEDURE: 1. Patellar tendon repair. 2. Lateral release retinaculum of the left knee.  DESCRIPTION OF PROCEDURE:  The patient was taken to the operating room after given adequate preop medication given general anesthesia was intubated, left lower extremity was prepped with DuraPrep and draped in a sterile manner.  Tourniquet and Bovie for used hemostasis.  Anterior midline incision was made over the left knee going through the old previous scar both through the skin and subcutaneous tissue.  Dissection was down to the patella and the fascia.  Defect was identified in mid substance of the patella with a lateral fibrotic scarring which appeared to be more than just 3 weeks ago.  Sharp and blunt dissection was made both medially and laterally.  A #2 FiberWire was made crisscross flexion both proximally and distally crossing disrupted area.  We were able to bring the edges into the patellar tendon and very good approximation. Lateral release of the lateral retinaculum was then done starting distally and lateral to the tibial plateau and all the way up to the quadriceps.  This was done subcapsular.  Copious irrigation was done. Hemostasis was obtained.  Wound closure was then done with 0 Vicryl for the deep subcu, 2-0 for the  subcu and skin staples for the skin.  Bulky compressive dressing was applied.  Knee immobilizer applied.  The patient tolerated procedure quite well and recovery room in stable and satisfactory condition.     Myrtie Neither, MD     AC/MEDQ  D:  08/07/2010  T:  08/08/2010  Job:  [id number]  Electronically Signed by Myrtie Neither MD on 08/09/2010 12:27:59 PM

## 2010-08-09 NOTE — H&P (Signed)
  NAMEDOMINICA, Bishop NO.:  [id number]  MEDICAL RECORD NO.:  [id number]  LOCATION:  5019                         FACILITY:  [facility]  PHYSICIAN:  Myrtie Neither, MD      DATE OF BIRTH:  [birth year]  DATE OF ADMISSION:  08/07/2010 DATE OF DISCHARGE:  08/08/2010                             HISTORY & PHYSICAL   CHIEF COMPLAINT:  Painful left total knee.  HISTORY OF PRESENT ILLNESS:  This is a 60 year old female who had undergone left total knee revision 2 years ago.  The patient has done quite well up until last 2 weeks when she developed pain, swelling, and difficulty trying to extend the left knee.  The patient is not certain what she did to cause this to happen.  The patient was seen by Dr. Renaye Rakers and referred to the office.  PAST MEDICAL HISTORY: 1. History of pulmonary intubation. 2. History of pulmonary embolus, September 2011. 3. History of hypertension. 4. Degenerative joint disease.  ALLERGIES:  ADVIL.  SOCIAL HISTORY:  Negative for use of tobacco, alcohol, or recreation drugs.  FAMILY HISTORY:  Hypertension.  REVIEW OF SYSTEMS:  Basically as in history of present illness.  No recent cardiac, respiratory, urinary, or bowel symptoms.  PHYSICAL EXAMINATION:  VITAL SIGNS:  Blood pressure 160/92, O2 saturation 60, pulse 86, respirations 18, temperature 98.3, weight 108.4 kg, and height 67.5 inches. HEAD:  Normocephalic. EYES:  Conjunctivae and sclerae clear. NECK:  Supple. CHEST:  Clear. CARDIAC:  S1-S2 regular. EXTREMITIES:  Left knee +2 effusion with tender palpable defect in the specimens of patellar tendon.  The patient is unable to fully extend the left knee with subluxing of the patella laterally.  IMAGING:  X-ray of the left knee only demonstrating effusion with total knee implant in place.  IMPRESSION:  Patellar tendon rupture, left knee; subluxing patella, left knee.  PLAN:  Patellar tendon repair, possible lateral release, left  knee.     Myrtie Neither, MD     AC/MEDQ  D:  08/08/2010  T:  08/09/2010  Job:  [id number]  Electronically Signed by Myrtie Neither MD on 08/09/2010 12:28:02 PM

## 2010-08-12 ENCOUNTER — Emergency Department (HOSPITAL_COMMUNITY)
Admission: EM | Admit: 2010-08-12 | Discharge: 2010-08-12 | Disposition: A | Discharge status: Home / Self Care | Payer: Medicaid Other | Attending: Emergency Medicine | Admitting: Emergency Medicine

## 2010-08-12 DIAGNOSIS — Z7901 Long term (current) use of anticoagulants: Secondary | ICD-10-CM | POA: Insufficient documentation

## 2010-08-12 DIAGNOSIS — I10 Essential (primary) hypertension: Secondary | ICD-10-CM | POA: Insufficient documentation

## 2010-08-12 DIAGNOSIS — R791 Abnormal coagulation profile: Secondary | ICD-10-CM | POA: Insufficient documentation

## 2010-08-12 DIAGNOSIS — Z86718 Personal history of other venous thrombosis and embolism: Secondary | ICD-10-CM | POA: Insufficient documentation

## 2010-08-12 DIAGNOSIS — F319 Bipolar disorder, unspecified: Secondary | ICD-10-CM | POA: Insufficient documentation

## 2010-08-12 DIAGNOSIS — T45515A Adverse effect of anticoagulants, initial encounter: Secondary | ICD-10-CM | POA: Insufficient documentation

## 2012-06-30 ENCOUNTER — Other Ambulatory Visit: Payer: Self-pay | Admitting: Family Medicine

## 2012-06-30 DIAGNOSIS — K5792 Diverticulitis of intestine, part unspecified, without perforation or abscess without bleeding: Secondary | ICD-10-CM

## 2012-07-01 ENCOUNTER — Ambulatory Visit
Admission: RE | Admit: 2012-07-01 | Discharge: 2012-07-01 | Disposition: A | Discharge status: Home / Self Care | Payer: Medicare Other | Source: Ambulatory Visit | Attending: Family Medicine | Admitting: Family Medicine

## 2012-07-01 DIAGNOSIS — K5792 Diverticulitis of intestine, part unspecified, without perforation or abscess without bleeding: Secondary | ICD-10-CM

## 2012-07-01 MED ORDER — IOHEXOL 300 MG/ML  SOLN
30.0000 mL | Freq: Once | INTRAMUSCULAR | Status: AC | PRN
  Administered 2012-07-01: 30 mL via ORAL

## 2012-07-01 MED ORDER — IOHEXOL 300 MG/ML  SOLN
125.0000 mL | Freq: Once | INTRAMUSCULAR | Status: AC | PRN
  Administered 2012-07-01: 125 mL via INTRAVENOUS

## 2012-07-02 ENCOUNTER — Encounter (HOSPITAL_COMMUNITY): Payer: Self-pay | Admitting: Emergency Medicine

## 2012-07-02 ENCOUNTER — Emergency Department (HOSPITAL_COMMUNITY)
Admission: EM | Admit: 2012-07-02 | Discharge: 2012-07-02 | Disposition: A | Discharge status: Home / Self Care | Payer: Medicare Other | Attending: Emergency Medicine | Admitting: Emergency Medicine

## 2012-07-02 DIAGNOSIS — Z9089 Acquired absence of other organs: Secondary | ICD-10-CM | POA: Insufficient documentation

## 2012-07-02 DIAGNOSIS — R109 Unspecified abdominal pain: Secondary | ICD-10-CM | POA: Insufficient documentation

## 2012-07-02 LAB — CBC WITH DIFFERENTIAL/PLATELET
Basophils Absolute: 0.1 10*3/uL (ref 0.0–0.1)
Basophils Relative: 1 % (ref 0–1)
Eosinophils Absolute: 0.2 10*3/uL (ref 0.0–0.7)
Eosinophils Relative: 4 % (ref 0–5)
HCT: 36.8 % (ref 36.0–46.0)
Hemoglobin: 11.6 g/dL — ABNORMAL LOW (ref 12.0–15.0)
Lymphocytes Relative: 26 % (ref 12–46)
Lymphs Abs: 1.1 10*3/uL (ref 0.7–4.0)
MCH: 29.2 pg (ref 26.0–34.0)
MCHC: 31.5 g/dL (ref 30.0–36.0)
MCV: 92.7 fL (ref 78.0–100.0)
Monocytes Absolute: 0.6 10*3/uL (ref 0.1–1.0)
Monocytes Relative: 14 % — ABNORMAL HIGH (ref 3–12)
Neutro Abs: 2.3 10*3/uL (ref 1.7–7.7)
Neutrophils Relative %: 54 % (ref 43–77)
Platelets: 310 10*3/uL (ref 150–400)
RBC: 3.97 MIL/uL (ref 3.87–5.11)
RDW: 13 % (ref 11.5–15.5)
WBC: 4.2 10*3/uL (ref 4.0–10.5)

## 2012-07-02 LAB — URINALYSIS, ROUTINE W REFLEX MICROSCOPIC
Bilirubin Urine: NEGATIVE
Glucose, UA: NEGATIVE mg/dL
Ketones, ur: NEGATIVE mg/dL
Leukocytes, UA: NEGATIVE
Nitrite: NEGATIVE
Protein, ur: 30 mg/dL — AB
Specific Gravity, Urine: 1.025 (ref 1.005–1.030)
Urobilinogen, UA: 0.2 mg/dL (ref 0.0–1.0)
pH: 6 (ref 5.0–8.0)

## 2012-07-02 LAB — URINE MICROSCOPIC-ADD ON

## 2012-07-02 LAB — COMPREHENSIVE METABOLIC PANEL
ALT: 8 U/L (ref 0–35)
AST: 13 U/L (ref 0–37)
Albumin: 3.7 g/dL (ref 3.5–5.2)
Alkaline Phosphatase: 60 U/L (ref 39–117)
BUN: 7 mg/dL (ref 6–23)
CO2: 29 mEq/L (ref 19–32)
Calcium: 9.5 mg/dL (ref 8.4–10.5)
Chloride: 104 mEq/L (ref 96–112)
Creatinine, Ser: 0.7 mg/dL (ref 0.50–1.10)
GFR calc Af Amer: >90 mL/min (ref >90)
GFR calc non Af Amer: >90 mL/min (ref >90)
Glucose, Bld: 99 mg/dL (ref 70–99)
Potassium: 3.5 mEq/L (ref 3.5–5.1)
Sodium: 142 mEq/L (ref 135–145)
Total Bilirubin: 0.4 mg/dL (ref 0.3–1.2)
Total Protein: 8.1 g/dL (ref 6.0–8.3)

## 2012-07-02 LAB — LIPASE, BLOOD: Lipase: 20 U/L (ref 11–59)

## 2012-07-02 LAB — PROTIME-INR
INR: 1.18 (ref 0.00–1.49)
Prothrombin Time: 14.8 seconds (ref 11.6–15.2)

## 2012-07-02 LAB — LACTIC ACID, PLASMA: Lactic Acid, Venous: 1.4 mmol/L (ref 0.5–2.2)

## 2012-07-02 MED ORDER — HYDROCODONE-ACETAMINOPHEN 5-325 MG PO TABS: 1.0000 | ORAL_TABLET | ORAL | Status: DC | PRN

## 2012-07-02 MED ORDER — ONDANSETRON HCL 4 MG/2ML IJ SOLN
4.0000 mg | Freq: Once | INTRAMUSCULAR | Status: AC
  Administered 2012-07-02: 4 mg via INTRAVENOUS
  Filled 2012-07-02: qty 2

## 2012-07-02 MED ORDER — MORPHINE SULFATE 4 MG/ML IJ SOLN
4.0000 mg | Freq: Once | INTRAMUSCULAR | Status: AC
  Administered 2012-07-02: 4 mg via INTRAVENOUS
  Filled 2012-07-02: qty 1

## 2012-07-02 MED ORDER — SODIUM CHLORIDE 0.9 % IV SOLN
INTRAVENOUS | Status: DC
  Administered 2012-07-02: 12:00:00 via INTRAVENOUS

## 2012-07-02 NOTE — ED Notes (Addendum)
Pt c/o sharp rlq abd/pelvic pain since Friday. Pt also reports some pain with urination. Denies n/v/d. lnbm-2 days ago.

## 2012-07-02 NOTE — ED Notes (Addendum)
Pt c/o RLQ pain since Friday night. Pt describes pain as sharp and constant. Pt was seen at Dr. Tedra Senegal office yesterday and was told to come here for evaluation after her CT scan. Pt denies NVD, chest pain, SOB. Pt reports dysuria.

## 2012-07-02 NOTE — ED Provider Notes (Signed)
History    This chart was scribed for Gilda Crease, * by Marlyne Beards, ED Scribe. The patient was seen in room APA11/APA11. Patient's care was started at 11:06 am.    CSN: [id number]  Arrival date & time 07/02/12  1054   First MD Initiated Contact with Patient 07/02/12 1106      Chief Complaint  Patient presents with  . Abdominal Pain    (Consider location/radiation/quality/duration/timing/severity/associated sxs/prior treatment) The history is provided by the patient. No language interpreter was used.   HPI Comments: Margaret Bishop is a 62 y.o. female who presents to the Emergency Department complaining of moderate constant lower abdominal pain with a sharp sensation that has been persistent since Friday. Pt states that the pain is exacerbated upon movement and urinating. Pt went to see Dr. Parke Simmers yesterday and a CT scan was done. Dr. Parke Simmers advised pt to come to the ED today for further evaluation. Pt denies any chest pain, fever, chills, cough, nausea, vomiting, diarrhea, SOB, weakness, and any other associated symptoms. Pt had a cholecystectomy in the past.   History reviewed. No pertinent past medical history.  Past Surgical History  Procedure Laterality Date  . Replacement total knee bilateral    . Cholecystectomy      No family history on file.  History  Substance Use Topics  . Smoking status: Never Smoker   . Smokeless tobacco: Not on file  . Alcohol Use: No    OB History   Grav Para Term Preterm Abortions TAB SAB Ect Mult Living                  Review of Systems  Gastrointestinal: Positive for abdominal pain.  All other systems reviewed and are negative.    Allergies  Onion; Strawberry; and Tomato  Home Medications  No current outpatient prescriptions on file.  BP 138/74  Pulse 81  Temp(Src) 98.8 F (37.1 C) (Oral)  Resp 20  Ht 5' 7.5" (1.715 m)  Wt 223 lb (101.152 kg)  BMI 34.39 kg/m2  SpO2 98%  Physical Exam  Nursing note and  vitals reviewed. Constitutional: She is oriented to person, place, and time. She appears well-developed and well-nourished. No distress.  HENT:  Head: Normocephalic and atraumatic.  Right Ear: Hearing normal.  Left Ear: Hearing normal.  Nose: Nose normal.  Mouth/Throat: Oropharynx is clear and moist and mucous membranes are normal.  Eyes: Conjunctivae and EOM are normal. Pupils are equal, round, and reactive to light.  Neck: Normal range of motion. Neck supple.  Cardiovascular: Regular rhythm, S1 normal and S2 normal.  Exam reveals no gallop and no friction rub.   No murmur heard. Pulmonary/Chest: Effort normal and breath sounds normal. No respiratory distress. She exhibits no tenderness.  Abdominal: Soft. Normal appearance and bowel sounds are normal. There is no hepatosplenomegaly. There is tenderness. There is no rebound, no guarding, no tenderness at McBurney's point and negative Murphy's sign. No hernia.  Tenderness upon palpation to RLQ abdominal area.   Musculoskeletal: Normal range of motion.  Neurological: She is alert and oriented to person, place, and time. She has normal strength. No cranial nerve deficit or sensory deficit. Coordination normal. GCS eye subscore is 4. GCS verbal subscore is 5. GCS motor subscore is 6.  Skin: Skin is warm, dry and intact. No rash noted. No cyanosis.  Psychiatric: She has a normal mood and affect. Her speech is normal and behavior is normal. Thought content normal.    ED Course  Procedures (including critical care time) DIAGNOSTIC STUDIES: Oxygen Saturation is 98% on room air, normal by my interpretation.    COORDINATION OF CARE: 11:12 AM Discussed ED treatment with pt and pt agrees.    Labs Reviewed  CBC WITH DIFFERENTIAL - Abnormal; Notable for the following:    Hemoglobin 11.6 (*)    Monocytes Relative 14 (*)    All other components within normal limits  COMPREHENSIVE METABOLIC PANEL  LACTIC ACID, PLASMA  LIPASE, BLOOD  URINALYSIS,  ROUTINE W REFLEX MICROSCOPIC   Ct Abdomen Pelvis W Contrast  07/01/2012   *RADIOLOGY REPORT*  Clinical Data: Lower abdominal pain, fever, evaluate for diverticulitis  CT ABDOMEN AND PELVIS WITH CONTRAST  Technique:  Multidetector CT imaging of the abdomen and pelvis was performed following the standard protocol during bolus administration of intravenous contrast.  Contrast: 125 ml Isovue 370 IV  Comparison: None.  Findings: Linear scarring in the left lower lobe.  Liver, spleen, pancreas, and adrenal glands are within normal limits.  Status post cholecystectomy.  No intrahepatic or extrahepatic ductal dilatation.  10 mm probable cyst in the posterior right lower kidney (series 5/image 22), although measuring just higher than simple fluid density.  Left kidney is within normal limits.  No hydronephrosis.  No evidence of bowel obstruction.  2.9 x 4.8 cm fat density lesion with surrounding inflammatory changes in the left mid abdominal mesentery (series 3/image 53), adjacent to but not directly involving the descending colon. Additional 2.0 x 3.1 cm well circumscribed fat density lesion beneath the left lateral abdominal wall (series 3/image 41).  The dominant lesion is favored to reflect acute epiploic appendagitis or possibly an omental infarct.  The smaller lesion likely reflects prior epiploic appendagitis (or less likely omental infarct).  Given the inflammatory stranding along the adjacent proximal descending colon (series 3/image 47), a mild diverticulitis is difficult to exclude, but is considered less likely given that the colon is not the epicenter of the abnormality.  No drainable fluid collection/abscess.  No free air.  Atherosclerotic calcifications of the abdominal aorta and branch vessels.  Trace pelvic ascites.  No suspicious abdominopelvic lymphadenopathy.  Uterus is mildly heterogeneous with a suspected 2.3 cm left posterior fundal fibroid (series 2/image 72). Bilateral ovaries unremarkable.   Bladder is within normal limits.  Tiny fat-containing right inguinal hernia.  Degenerative changes of the visualized thoracolumbar spine.  IMPRESSION: Suspected acute epiploic appendagitis or possibly omental infarct in the left mid abdominal mesentery.  Additional suspected chronic epiploic appendagitis/omental infarct beneath the left lateral abdominal wall.  Mild diverticulitis is difficult to exclude but is considered unlikely. No drainable fluid collection/abscess.  No free air.   Original Report Authenticated By: Charline Bills, M.D.     Diagnosis: Abdominal pain    MDM  The patient presents to the ER for evaluation abdominal pain. Patient to her primary doctor yesterday and had a CAT scan performed. Results came back today and patient was called, told to come to the ER because she surgery. The CAT scan was performed and a consistent I was able to review the records. Patient has suspected acute epiploic appendage it is possibly omental infarct in the left mid abdominal mesentery. This doesn't exactly explain the patient's pain, however. Patient's pain is right lower quadrant, actually below that in the right inguinal area and hurts when she raises her leg. This seems more musculoskeletal in nature, but with the CAT scan findings I did perform laboratory to reevaluate. Patient has a normal white blood cell.  All her labs are essentially normal. Lactate is negative.  The case was discussed with Doctor Leticia Penna. After reviewing the patient's current presentation, all of her current labs, he had the CAT scan findings, he did not feel that the patient would need any interventions. He recommended pain control and he will see her in the office. I went back to recheck the patient and she is resting comfortably, currently no pain. We'll, however, be prescribed Lortab to use as needed for any pain that occurs. Patient was told to come back to the ER immediately if she has fever, increasing pain, or  vomiting.     I personally performed the services described in this documentation, which was scribed in my presence. The recorded information has been reviewed and is accurate.    Gilda Crease, MD 07/02/12 [phone]

## 2012-07-03 LAB — URINE CULTURE: Colony Count: 50000

## 2013-05-27 ENCOUNTER — Other Ambulatory Visit (HOSPITAL_COMMUNITY): Payer: Self-pay | Admitting: Family Medicine

## 2013-05-27 DIAGNOSIS — Z1231 Encounter for screening mammogram for malignant neoplasm of breast: Secondary | ICD-10-CM

## 2013-05-29 ENCOUNTER — Ambulatory Visit (HOSPITAL_COMMUNITY)
Admission: RE | Admit: 2013-05-29 | Discharge: 2013-05-29 | Disposition: A | Discharge status: Home / Self Care | Payer: Medicare Other | Source: Ambulatory Visit | Attending: Family Medicine | Admitting: Family Medicine

## 2013-05-29 DIAGNOSIS — Z1231 Encounter for screening mammogram for malignant neoplasm of breast: Secondary | ICD-10-CM

## 2014-05-27 DIAGNOSIS — Z0000 Encounter for general adult medical examination without abnormal findings: Secondary | ICD-10-CM | POA: Diagnosis not present

## 2014-05-27 DIAGNOSIS — E0800 Diabetes mellitus due to underlying condition with hyperosmolarity without nonketotic hyperglycemic-hyperosmolar coma (NKHHC): Secondary | ICD-10-CM | POA: Diagnosis not present

## 2014-06-22 DIAGNOSIS — K439 Ventral hernia without obstruction or gangrene: Secondary | ICD-10-CM | POA: Diagnosis not present

## 2014-11-01 DIAGNOSIS — E6609 Other obesity due to excess calories: Secondary | ICD-10-CM | POA: Diagnosis not present

## 2014-11-01 DIAGNOSIS — F3189 Other bipolar disorder: Secondary | ICD-10-CM | POA: Diagnosis not present

## 2014-11-01 DIAGNOSIS — K439 Ventral hernia without obstruction or gangrene: Secondary | ICD-10-CM | POA: Diagnosis not present

## 2014-11-17 ENCOUNTER — Ambulatory Visit: Payer: Self-pay | Admitting: General Surgery

## 2014-11-25 ENCOUNTER — Encounter (HOSPITAL_COMMUNITY)
Admission: RE | Admit: 2014-11-25 | Discharge: 2014-11-25 | Disposition: A | Discharge status: Home / Self Care | Payer: Medicare Other | Source: Ambulatory Visit | Attending: General Surgery | Admitting: General Surgery

## 2014-11-25 ENCOUNTER — Encounter (HOSPITAL_COMMUNITY): Payer: Self-pay

## 2014-11-25 DIAGNOSIS — I10 Essential (primary) hypertension: Secondary | ICD-10-CM | POA: Diagnosis not present

## 2014-11-25 DIAGNOSIS — Z01812 Encounter for preprocedural laboratory examination: Secondary | ICD-10-CM | POA: Diagnosis not present

## 2014-11-25 DIAGNOSIS — K429 Umbilical hernia without obstruction or gangrene: Secondary | ICD-10-CM | POA: Diagnosis not present

## 2014-11-25 DIAGNOSIS — R000 Tachycardia, unspecified: Secondary | ICD-10-CM | POA: Diagnosis not present

## 2014-11-25 DIAGNOSIS — Z01818 Encounter for other preprocedural examination: Secondary | ICD-10-CM | POA: Diagnosis present

## 2014-11-25 HISTORY — DX: Personal history of urinary calculi: Z87.442

## 2014-11-25 HISTORY — DX: Personal history of other venous thrombosis and embolism: Z86.718

## 2014-11-25 HISTORY — DX: Gastro-esophageal reflux disease without esophagitis: K21.9

## 2014-11-25 HISTORY — DX: Bipolar disorder, unspecified: F31.9

## 2014-11-25 HISTORY — DX: Essential (primary) hypertension: I10

## 2014-11-25 HISTORY — DX: Reserved for inherently not codable concepts without codable children: IMO0001

## 2014-11-25 HISTORY — DX: Unspecified osteoarthritis, unspecified site: M19.90

## 2014-11-25 LAB — BASIC METABOLIC PANEL
Anion gap: 7 (ref 5–15)
BUN: 7 mg/dL (ref 6–20)
CO2: 27 mmol/L (ref 22–32)
Calcium: 9.2 mg/dL (ref 8.9–10.3)
Chloride: 110 mmol/L (ref 101–111)
Creatinine, Ser: 0.75 mg/dL (ref 0.44–1.00)
GFR calc Af Amer: >60 mL/min (ref >60)
GFR calc non Af Amer: >60 mL/min (ref >60)
Glucose, Bld: 100 mg/dL — ABNORMAL HIGH (ref 65–99)
Potassium: 3.7 mmol/L (ref 3.5–5.1)
Sodium: 144 mmol/L (ref 135–145)

## 2014-11-25 LAB — CBC
HCT: 37.5 % (ref 36.0–46.0)
Hemoglobin: 11.8 g/dL — ABNORMAL LOW (ref 12.0–15.0)
MCH: 29.4 pg (ref 26.0–34.0)
MCHC: 31.5 g/dL (ref 30.0–36.0)
MCV: 93.5 fL (ref 78.0–100.0)
Platelets: 245 10*3/uL (ref 150–400)
RBC: 4.01 MIL/uL (ref 3.87–5.11)
RDW: 13.2 % (ref 11.5–15.5)
WBC: 5.2 10*3/uL (ref 4.0–10.5)

## 2014-11-25 NOTE — Pre-Procedure Instructions (Signed)
MACHELE DEIHL  11/25/2014     Your procedure is scheduled on : Tuesday November 30, 2014 at 9:30 AM.  Report to [facility] Admitting at 7:30 AM.  Call this number if you have problems the morning of surgery: [phone]    Remember:  Do not eat food or drink liquids after midnight.  Take these medicines the morning of surgery with A SIP OF WATER : Acetaminophen (Tylenol) if needed, Albuterol inhaler if needed, Amlodipine (Norvasc), Hydrocodone if needed, Lamotrigine (Lamictal), Lithium, Omeprazole (Prilosec)   Please follow your physicians instructions regarding Coumadin   Stop taking any vitamins, herbal medications, Ibuprofen, Advil, Motrin, Aleve, etc   Do not wear jewelry, make-up or nail polish.  Do not wear lotions, powders, or perfumes.    Do not shave 48 hours prior to surgery.    Do not bring valuables to the hospital.  [facility] is not responsible for any belongings or valuables.  Contacts, dentures or bridgework may not be worn into surgery.  Leave your suitcase in the car.  After surgery it may be brought to your room.  For patients admitted to the hospital, discharge time will be determined by your treatment team.  Patients discharged the day of surgery will not be allowed to drive home.   Name and phone number of your driver:    Special instructions:  Shower using CHG soap the night before and the morning of your surgery  Please read over the following fact sheets that you were given. Pain Booklet, Coughing and Deep Breathing and Surgical Site Infection Prevention

## 2014-11-25 NOTE — Progress Notes (Signed)
PCP is Lucianne Lei  Patient denied having any acute cardiac issues and informed Nurse that she last used Albuterol inhaler two days ago  Nurse inquired about Coumadin and patient stated she last took Coumadin on 11/21/14.   Patients daughter Arrie Aran was at chair side during PAT visit. Patient had some questions about consent, therefore consent will be signed DOS.

## 2014-11-29 MED ORDER — CHLORHEXIDINE GLUCONATE 4 % EX LIQD: 1.0000 "application " | Freq: Once | CUTANEOUS | Status: DC

## 2014-11-29 MED ORDER — CEFAZOLIN SODIUM-DEXTROSE 2-3 GM-% IV SOLR
2.0000 g | INTRAVENOUS | Status: AC
  Administered 2014-11-30: 2 g via INTRAVENOUS
  Filled 2014-11-29: qty 50

## 2014-11-30 ENCOUNTER — Ambulatory Visit (HOSPITAL_COMMUNITY): Payer: Medicare Other | Admitting: Certified Registered Nurse Anesthetist

## 2014-11-30 ENCOUNTER — Ambulatory Visit (HOSPITAL_COMMUNITY)
Admission: RE | Admit: 2014-11-30 | Discharge: 2014-11-30 | Disposition: A | Discharge status: Home / Self Care | Payer: Medicare Other | Source: Ambulatory Visit | Attending: General Surgery | Admitting: General Surgery

## 2014-11-30 ENCOUNTER — Encounter (HOSPITAL_COMMUNITY)
Admission: RE | Disposition: A | Discharge status: Home / Self Care | Payer: Self-pay | Source: Ambulatory Visit | Attending: General Surgery

## 2014-11-30 ENCOUNTER — Encounter (HOSPITAL_COMMUNITY): Payer: Self-pay | Admitting: *Deleted

## 2014-11-30 DIAGNOSIS — K219 Gastro-esophageal reflux disease without esophagitis: Secondary | ICD-10-CM | POA: Diagnosis not present

## 2014-11-30 DIAGNOSIS — Z6836 Body mass index (BMI) 36.0-36.9, adult: Secondary | ICD-10-CM | POA: Insufficient documentation

## 2014-11-30 DIAGNOSIS — M1990 Unspecified osteoarthritis, unspecified site: Secondary | ICD-10-CM | POA: Diagnosis not present

## 2014-11-30 DIAGNOSIS — K429 Umbilical hernia without obstruction or gangrene: Secondary | ICD-10-CM | POA: Diagnosis present

## 2014-11-30 DIAGNOSIS — K430 Incisional hernia with obstruction, without gangrene: Secondary | ICD-10-CM | POA: Insufficient documentation

## 2014-11-30 DIAGNOSIS — I10 Essential (primary) hypertension: Secondary | ICD-10-CM | POA: Insufficient documentation

## 2014-11-30 DIAGNOSIS — F319 Bipolar disorder, unspecified: Secondary | ICD-10-CM | POA: Diagnosis not present

## 2014-11-30 DIAGNOSIS — K436 Other and unspecified ventral hernia with obstruction, without gangrene: Secondary | ICD-10-CM | POA: Diagnosis not present

## 2014-11-30 HISTORY — PX: INSERTION OF MESH: SHX5868

## 2014-11-30 HISTORY — PX: UMBILICAL HERNIA REPAIR: SHX196

## 2014-11-30 SURGERY — REPAIR, HERNIA, UMBILICAL, LAPAROSCOPIC
Anesthesia: General | Site: Abdomen

## 2014-11-30 MED ORDER — ONDANSETRON HCL 4 MG/2ML IJ SOLN
INTRAMUSCULAR | Status: DC | PRN
  Administered 2014-11-30: 4 mg via INTRAVENOUS

## 2014-11-30 MED ORDER — MIDAZOLAM HCL 2 MG/2ML IJ SOLN
INTRAMUSCULAR | Status: AC
  Filled 2014-11-30: qty 4

## 2014-11-30 MED ORDER — ACETAMINOPHEN 325 MG PO TABS: 325.0000 mg | ORAL_TABLET | ORAL | Status: DC | PRN

## 2014-11-30 MED ORDER — ACETAMINOPHEN 160 MG/5ML PO SOLN
325.0000 mg | ORAL | Status: DC | PRN
  Filled 2014-11-30: qty 20.3

## 2014-11-30 MED ORDER — OXYCODONE HCL 5 MG/5ML PO SOLN: 5.0000 mg | Freq: Once | ORAL | Status: AC | PRN

## 2014-11-30 MED ORDER — BUPIVACAINE HCL (PF) 0.25 % IJ SOLN
INTRAMUSCULAR | Status: AC
  Filled 2014-11-30: qty 30

## 2014-11-30 MED ORDER — FENTANYL CITRATE (PF) 250 MCG/5ML IJ SOLN
INTRAMUSCULAR | Status: AC
  Filled 2014-11-30: qty 5

## 2014-11-30 MED ORDER — EPHEDRINE SULFATE 50 MG/ML IJ SOLN
INTRAMUSCULAR | Status: DC | PRN
  Administered 2014-11-30: 10 mg via INTRAVENOUS

## 2014-11-30 MED ORDER — OXYCODONE HCL 5 MG PO TABS
ORAL_TABLET | ORAL | Status: AC
  Filled 2014-11-30: qty 1

## 2014-11-30 MED ORDER — LIDOCAINE HCL (CARDIAC) 20 MG/ML IV SOLN
INTRAVENOUS | Status: DC | PRN
  Administered 2014-11-30: 80 mg via INTRAVENOUS

## 2014-11-30 MED ORDER — FENTANYL CITRATE (PF) 100 MCG/2ML IJ SOLN
INTRAMUSCULAR | Status: DC | PRN
  Administered 2014-11-30: 50 ug via INTRAVENOUS
  Administered 2014-11-30: 100 ug via INTRAVENOUS

## 2014-11-30 MED ORDER — SUGAMMADEX SODIUM 200 MG/2ML IV SOLN
INTRAVENOUS | Status: AC
  Filled 2014-11-30: qty 2

## 2014-11-30 MED ORDER — NEOSTIGMINE METHYLSULFATE 10 MG/10ML IV SOLN
INTRAVENOUS | Status: AC
  Filled 2014-11-30: qty 1

## 2014-11-30 MED ORDER — OXYCODONE HCL 5 MG PO TABS
5.0000 mg | ORAL_TABLET | Freq: Once | ORAL | Status: AC | PRN
  Administered 2014-11-30: 5 mg via ORAL

## 2014-11-30 MED ORDER — SUGAMMADEX SODIUM 500 MG/5ML IV SOLN
INTRAVENOUS | Status: AC
Start: 2014-11-30 — End: 2014-11-30
  Filled 2014-11-30: qty 5

## 2014-11-30 MED ORDER — HYDROMORPHONE HCL 1 MG/ML IJ SOLN
INTRAMUSCULAR | Status: AC
  Filled 2014-11-30: qty 1

## 2014-11-30 MED ORDER — PROPOFOL 10 MG/ML IV BOLUS
INTRAVENOUS | Status: DC | PRN
  Administered 2014-11-30: 130 mg via INTRAVENOUS

## 2014-11-30 MED ORDER — GLYCOPYRROLATE 0.2 MG/ML IJ SOLN
INTRAMUSCULAR | Status: AC
  Filled 2014-11-30: qty 3

## 2014-11-30 MED ORDER — SUGAMMADEX SODIUM 200 MG/2ML IV SOLN
INTRAVENOUS | Status: DC | PRN
  Administered 2014-11-30: 210 mg via INTRAVENOUS

## 2014-11-30 MED ORDER — LACTATED RINGERS IV SOLN
INTRAVENOUS | Status: DC
  Administered 2014-11-30 (×2): via INTRAVENOUS

## 2014-11-30 MED ORDER — DEXAMETHASONE SODIUM PHOSPHATE 4 MG/ML IJ SOLN
INTRAMUSCULAR | Status: DC | PRN
  Administered 2014-11-30: 4 mg via INTRAVENOUS

## 2014-11-30 MED ORDER — PHENYLEPHRINE HCL 10 MG/ML IJ SOLN
10.0000 mg | INTRAVENOUS | Status: DC | PRN
  Administered 2014-11-30: 25 ug/min via INTRAVENOUS

## 2014-11-30 MED ORDER — SODIUM CHLORIDE 0.9 % IR SOLN
Status: DC | PRN
  Administered 2014-11-30: 1000 mL

## 2014-11-30 MED ORDER — HYDROMORPHONE HCL 1 MG/ML IJ SOLN
0.2500 mg | INTRAMUSCULAR | Status: DC | PRN
  Administered 2014-11-30 (×3): 0.5 mg via INTRAVENOUS

## 2014-11-30 MED ORDER — ROCURONIUM BROMIDE 50 MG/5ML IV SOLN
INTRAVENOUS | Status: AC
  Filled 2014-11-30: qty 1

## 2014-11-30 MED ORDER — ROCURONIUM BROMIDE 100 MG/10ML IV SOLN
INTRAVENOUS | Status: DC | PRN
  Administered 2014-11-30: 10 mg via INTRAVENOUS
  Administered 2014-11-30: 50 mg via INTRAVENOUS

## 2014-11-30 MED ORDER — ACETAMINOPHEN 10 MG/ML IV SOLN
INTRAVENOUS | Status: AC
  Filled 2014-11-30: qty 100

## 2014-11-30 MED ORDER — OXYCODONE-ACETAMINOPHEN 5-325 MG PO TABS: 1.0000 | ORAL_TABLET | ORAL | Status: DC | PRN

## 2014-11-30 MED ORDER — BUPIVACAINE HCL 0.25 % IJ SOLN
INTRAMUSCULAR | Status: DC | PRN
  Administered 2014-11-30: 30 mL

## 2014-11-30 MED ORDER — ACETAMINOPHEN 10 MG/ML IV SOLN
1000.0000 mg | Freq: Once | INTRAVENOUS | Status: AC
  Administered 2014-11-30: 1000 mg via INTRAVENOUS

## 2014-11-30 SURGICAL SUPPLY — 59 items
APL SKNCLS STERI-STRIP NONHPOA (GAUZE/BANDAGES/DRESSINGS) ×1
APPLIER CLIP LOGIC TI 5 (MISCELLANEOUS) IMPLANT
APPLIER CLIP ROT 10 11.4 M/L (STAPLE)
APR CLP MED LRG 11.4X10 (STAPLE)
APR CLP MED LRG 33X5 (MISCELLANEOUS)
BENZOIN TINCTURE PRP APPL 2/3 (GAUZE/BANDAGES/DRESSINGS) ×2 IMPLANT
BLADE SURG ROTATE 9660 (MISCELLANEOUS) IMPLANT
CANISTER SUCTION 2500CC (MISCELLANEOUS) IMPLANT
CHLORAPREP W/TINT 26ML (MISCELLANEOUS) ×2 IMPLANT
CLIP APPLIE ROT 10 11.4 M/L (STAPLE) IMPLANT
COVER SURGICAL LIGHT HANDLE (MISCELLANEOUS) ×2 IMPLANT
DEVICE SECURE STRAP 25 ABSORB (INSTRUMENTS) ×3 IMPLANT
DEVICE TROCAR PUNCTURE CLOSURE (ENDOMECHANICALS) ×2 IMPLANT
ELECT REM PT RETURN 9FT ADLT (ELECTROSURGICAL) ×2
ELECTRODE REM PT RTRN 9FT ADLT (ELECTROSURGICAL) ×1 IMPLANT
GAUZE SPONGE 2X2 8PLY STRL LF (GAUZE/BANDAGES/DRESSINGS) IMPLANT
GAUZE SPONGE 4X4 12PLY STRL (GAUZE/BANDAGES/DRESSINGS) ×1 IMPLANT
GLOVE BIO SURGEON STRL SZ7 (GLOVE) ×1 IMPLANT
GLOVE BIO SURGEON STRL SZ7.5 (GLOVE) ×2 IMPLANT
GLOVE BIO SURGEON STRL SZ8.5 (GLOVE) ×1 IMPLANT
GLOVE BIOGEL PI IND STRL 6.5 (GLOVE) IMPLANT
GLOVE BIOGEL PI IND STRL 8 (GLOVE) IMPLANT
GLOVE BIOGEL PI IND STRL 8.5 (GLOVE) IMPLANT
GLOVE BIOGEL PI INDICATOR 6.5 (GLOVE) ×1
GLOVE BIOGEL PI INDICATOR 8 (GLOVE) ×1
GLOVE BIOGEL PI INDICATOR 8.5 (GLOVE) ×1
GLOVE SURG SS PI 6.5 STRL IVOR (GLOVE) ×1 IMPLANT
GOWN STRL REUS W/ TWL LRG LVL3 (GOWN DISPOSABLE) ×2 IMPLANT
GOWN STRL REUS W/ TWL XL LVL3 (GOWN DISPOSABLE) ×1 IMPLANT
GOWN STRL REUS W/TWL LRG LVL3 (GOWN DISPOSABLE) ×4
GOWN STRL REUS W/TWL XL LVL3 (GOWN DISPOSABLE) ×4
KIT BASIN OR (CUSTOM PROCEDURE TRAY) ×2 IMPLANT
KIT ROOM TURNOVER OR (KITS) ×2 IMPLANT
MARKER SKIN DUAL TIP RULER LAB (MISCELLANEOUS) ×2 IMPLANT
MESH VENTRALIGHT ST 6IN CRC (Mesh General) ×1 IMPLANT
NDL INSUFFLATION 14GA 120MM (NEEDLE) ×1 IMPLANT
NDL SPNL 22GX3.5 QUINCKE BK (NEEDLE) IMPLANT
NEEDLE INSUFFLATION 14GA 120MM (NEEDLE) ×2 IMPLANT
NEEDLE SPNL 22GX3.5 QUINCKE BK (NEEDLE) IMPLANT
NS IRRIG 1000ML POUR BTL (IV SOLUTION) ×2 IMPLANT
PAD ARMBOARD 7.5X6 YLW CONV (MISCELLANEOUS) ×4 IMPLANT
SCISSORS LAP 5X35 DISP (ENDOMECHANICALS) ×2 IMPLANT
SET IRRIG TUBING LAPAROSCOPIC (IRRIGATION / IRRIGATOR) IMPLANT
SLEEVE ENDOPATH XCEL 5M (ENDOMECHANICALS) ×3 IMPLANT
SPONGE GAUZE 2X2 STER 10/PKG (GAUZE/BANDAGES/DRESSINGS) ×1
STRIP CLOSURE SKIN 1/2X4 (GAUZE/BANDAGES/DRESSINGS) ×2 IMPLANT
SUT CHROMIC 2 0 SH (SUTURE) ×2 IMPLANT
SUT MNCRL AB 4-0 PS2 18 (SUTURE) ×3 IMPLANT
SUT NOVA 1 T20/GS 25DT (SUTURE) ×1 IMPLANT
SUT PROLENE 2 0 KS (SUTURE) ×2 IMPLANT
TAPE CLOTH SURG 4X10 WHT LF (GAUZE/BANDAGES/DRESSINGS) ×1 IMPLANT
TOWEL OR 17X24 6PK STRL BLUE (TOWEL DISPOSABLE) ×2 IMPLANT
TOWEL OR 17X26 10 PK STRL BLUE (TOWEL DISPOSABLE) ×1 IMPLANT
TRAY FOLEY CATH 14FR (SET/KITS/TRAYS/PACK) IMPLANT
TRAY LAPAROSCOPIC MC (CUSTOM PROCEDURE TRAY) ×2 IMPLANT
TROCAR XCEL BLUNT TIP 100MML (ENDOMECHANICALS) IMPLANT
TROCAR XCEL NON-BLD 11X100MML (ENDOMECHANICALS) IMPLANT
TROCAR XCEL NON-BLD 5MMX100MML (ENDOMECHANICALS) ×2 IMPLANT
TUBING INSUFFLATION (TUBING) ×2 IMPLANT

## 2014-11-30 NOTE — Anesthesia Preprocedure Evaluation (Addendum)
Anesthesia Evaluation  Patient identified by MRN, date of birth, ID band Patient awake    Reviewed: Allergy & Precautions, NPO status , Patient's Chart, lab work & pertinent test results  History of Anesthesia Complications Negative for: history of anesthetic complications  Airway Mallampati: II  TM Distance: >3 FB Neck ROM: Full    Dental  (+) Upper Dentures, Lower Dentures, Dental Advisory Given   Pulmonary shortness of breath and with exertion, neg sleep apnea, neg COPD, neg recent URI, neg PE   Pulmonary exam normal breath sounds clear to auscultation       Cardiovascular hypertension, Pt. on medications (-) angina(-) Past MI and (-) CHF Normal cardiovascular exam Rhythm:Regular     Neuro/Psych PSYCHIATRIC DISORDERS Bipolar Disorder negative neurological ROS     GI/Hepatic Neg liver ROS, GERD  Controlled,  Endo/Other  Morbid obesity  Renal/GU negative Renal ROS     Musculoskeletal  (+) Arthritis ,   Abdominal Normal abdominal exam  (+)   Peds  Hematology Coumadin for dvt   Anesthesia Other Findings   Reproductive/Obstetrics                            Anesthesia Physical Anesthesia Plan  ASA: II  Anesthesia Plan: General   Post-op Pain Management:    Induction: Intravenous  Airway Management Planned: Oral ETT  Additional Equipment: None  Intra-op Plan:   Post-operative Plan: Extubation in OR  Informed Consent: I have reviewed the patients History and Physical, chart, labs and discussed the procedure including the risks, benefits and alternatives for the proposed anesthesia with the patient or authorized representative who has indicated his/her understanding and acceptance.   Dental advisory given  Plan Discussed with: CRNA and Surgeon  Anesthesia Plan Comments:        Anesthesia Quick Evaluation

## 2014-11-30 NOTE — Progress Notes (Signed)
Pt in phase 2. Attempted to dress for d/c. Patient having 8/10 pain with activity. Re-started IV fluids and will give divided doses of dilaudid then will re-attempt discharge.

## 2014-11-30 NOTE — Progress Notes (Signed)
Updated Dr Rosendo Gros regarding patient's delay in readiness for discharge. Orders for IV tylenol and give patient more time in recovery.

## 2014-11-30 NOTE — H&P (Signed)
History of Present Illness Ralene Ok MD; 06/22/2014 2:35 PM) Patient words: evaluate ventral hernia.  The patient is a 64 year old female who presents with an incisional hernia. 64 year old female who is referred by Dr. Criss Rosales for evaluation of a ventral hernia. Patient states that this hernia is been there for approximate 5 years. She states that it has gotten bigger over time. She does state that is bothersome and causing him some discomfort. She states there is been no signs or symptoms of small bowel incarceration or strangulation. She states that the hernia is noticeable when she sits up at times. She does state that it goes down its own. Of note the patient had a previous lap cholecystectomy.   Other Problems Ivor Costa, [place]; 06/22/2014 2:02 PM) Arthritis Cholelithiasis Ventral Hernia Repair  Past Surgical History Ivor Costa, [facility]; 06/22/2014 2:02 PM) Gallbladder Surgery - Laparoscopic Gallbladder Surgery - Open Knee Surgery Left.  Diagnostic Studies History Ivor Costa, [place]; 06/22/2014 2:02 PM) Colonoscopy never Mammogram within last year Pap Smear >5 years ago  Allergies Ivor Costa, [place]; 06/22/2014 2:03 PM) Tomato Flavor *PHARMACEUTICAL ADJUVANTS* Strawberry C *VITAMINS*  Medication History Ivor Costa, CMA; 06/22/2014 2:03 PM) AmLODIPine Besylate (5MG  Tablet, Oral) Active. Azor (5-40MG  Tablet, Oral) Active. LamoTRIgine (25MG  Tablet, Oral) Active. Lithium Carbonate (300MG  Capsule, Oral) Active. Omeprazole (20MG  Capsule DR, Oral) Active. Warfarin Sodium (4MG  Tablet, Oral) Active.  Social History Ivor Costa, [place]; 06/22/2014 2:02 PM) Caffeine use Coffee. No alcohol use No drug use Tobacco use Never smoker.  Family History Ivor Costa, [place]; 06/22/2014 2:02 PM) Arthritis Father.  Pregnancy / Birth History Ivor Costa, [place]; 06/22/2014 2:02 PM) Age at menarche 58 years. Gravida 2 Maternal age 47-25 Para  2    Review of Systems Ivor Costa CMA; 06/22/2014 2:02 PM) General Not Present- Appetite Loss, Chills, Fatigue, Fever, Night Sweats, Weight Gain and Weight Loss. Skin Not Present- Change in Wart/Mole, Dryness, Hives, Jaundice, New Lesions, Non-Healing Wounds, Rash and Ulcer. HEENT Present- Wears glasses/contact lenses. Not Present- Earache, Hearing Loss, Hoarseness, Nose Bleed, Oral Ulcers, Ringing in the Ears, Seasonal Allergies, Sinus Pain, Sore Throat, Visual Disturbances and Yellow Eyes. Respiratory Not Present- Bloody sputum, Chronic Cough, Difficulty Breathing, Snoring and Wheezing. Breast Not Present- Breast Mass, Breast Pain, Nipple Discharge and Skin Changes. Cardiovascular Present- Swelling of Extremities. Not Present- Chest Pain, Difficulty Breathing Lying Down, Leg Cramps, Palpitations, Rapid Heart Rate and Shortness of Breath. Gastrointestinal Not Present- Abdominal Pain, Bloating, Bloody Stool, Change in Bowel Habits, Chronic diarrhea, Constipation, Difficulty Swallowing, Excessive gas, Gets full quickly at meals, Hemorrhoids, Indigestion, Nausea, Rectal Pain and Vomiting. Female Genitourinary Not Present- Frequency, Nocturia, Painful Urination, Pelvic Pain and Urgency. Musculoskeletal Present- Swelling of Extremities. Not Present- Back Pain, Joint Pain, Joint Stiffness, Muscle Pain and Muscle Weakness. Neurological Not Present- Decreased Memory, Fainting, Headaches, Numbness, Seizures, Tingling, Tremor, Trouble walking and Weakness. Psychiatric Not Present- Anxiety, Bipolar, Change in Sleep Pattern, Depression, Fearful and Frequent crying. Endocrine Not Present- Cold Intolerance, Excessive Hunger, Hair Changes, Heat Intolerance, Hot flashes and New Diabetes. Hematology Not Present- Easy Bruising, Excessive bleeding, Gland problems, HIV and Persistent Infections.  BP 162/74 mmHg  Pulse 75  Temp(Src) 99.3 F (37.4 C) (Oral)  Resp 16  Ht 5' 7.5" (1.715 m)  Wt 106.142 kg (234  lb)  BMI 36.09 kg/m2  SpO2 99%    Physical Exam Ralene Ok MD; 06/22/2014 2:35 PM) General Mental Status-Alert. General Appearance-Consistent with stated age. Hydration-Well hydrated. Voice-Normal.  Head and Neck Head-normocephalic, atraumatic with no  lesions or palpable masses. Trachea-midline. Thyroid Gland Characteristics - normal size and consistency.  Chest and Lung Exam Chest and lung exam reveals -quiet, even and easy respiratory effort with no use of accessory muscles and on auscultation, normal breath sounds, no adventitious sounds and normal vocal resonance. Inspection Chest Wall - Normal. Back - normal.  Cardiovascular Cardiovascular examination reveals -normal heart sounds, regular rate and rhythm with no murmurs and normal pedal pulses bilaterally.  Abdomen Inspection Skin - Scar - no surgical scars. Hernias - Ventral - Incarcerated. Palpation/Percussion Normal exam - Soft, Non Tender, No Rebound tenderness, No Rigidity (guarding) and No hepatosplenomegaly. Auscultation Normal exam - Bowel sounds normal.    Assessment & Plan Ralene Ok MD; 06/22/2014 2:37 PM) VENTRAL HERNIA WITHOUT OBSTRUCTION OR GANGRENE (553.20  K43.9) Impression: 64 year old female with a ventral hernia.  1. Patient would like to proceed to the operating for a laparoscopic ventral hernia repair with mesh. Per CT scan the hernia mass is approximately 4 cm in size. 2. All risks and benefits were discussed with the patient to generally include, but not limited to: infection, bleeding, damage to surrounding structures, acute and chronic nerve pain, and recurrence. Alternatives were offered and described. All questions were answered and the patient voiced understanding of the procedure and wishes to proceed at this point with hernia repair.

## 2014-11-30 NOTE — Op Note (Addendum)
11/30/2014  10:43 AM  PATIENT:  Margaret Bishop  64 y.o. female  PRE-OPERATIVE DIAGNOSIS:  VENTRAL, UMBILICAL HERNIA  POST-OPERATIVE DIAGNOSIS:  INCARCERATED VENTRAL, UMBILICAL HERNIA  PROCEDURE:  Procedure(s): LAPAROSCOPIC UMBILICAL HERNIA REPAIR WITH MESH (N/A) INSERTION OF MESH (N/A)  SURGEON:  Surgeon(s) and Role:    * Ralene Ok, MD - Primary  ASSISTANTS: none   ANESTHESIA:   local and general  EBL:  Total I/O In: 800 [I.V.:800] Out: 20 [Blood:20]  BLOOD ADMINISTERED:none  DRAINS: none   LOCAL MEDICATIONS USED:  BUPIVICAINE   SPECIMEN:  No Specimen  DISPOSITION OF SPECIMEN:  N/A  COUNTS:  YES  TOURNIQUET:  * No tourniquets in log *  DICTATION: .Dragon Dictation  Details of the procedure:   After the patient was consented patient was taken back to the operating room patient was then placed in supine position bilateral SCDs in place.  The patient was prepped and draped in the usual sterile fashion. After antibiotics were confirmed a timeout was called and all facts were verified. The Veress needle technique was used to insuflate the abdomen at Palmer's point. The abdomen was insufflated to 14 mm mercury. Subsequently a 5 mm trocar was placed a camera inserted there was no injury to any intra-abdominal organs.    There was seen to be an incarcerated incisional hernia approximately 3 - 3.5cm.  A second camera port was in placed into the left lower quadrant.   At this the Falicform ligament was taken down with Bovie cautery maintaining hemostasis. A 35mm port was placed in the umbilicus.   I proceeded to reduced the omental hernia contents.  Once the hernia was cleared away, a Bard Ventralight 15.2cm  mesh was inserted into the abdomen.  The mesh was secured circumferentially with am Securestrap tacker in a double crown fashion.  2-0 prolenes were used at 3:00, 6:00, 9:00, and 12:00 as transfascial sutures using a endoclose decive.    The omentum was brought over  the area of the mesh. The pneumoperitoneum was evacuated  & all trocars  were removed. The skin was reapproximated with 4-0  Monocryl sutures in a subcuticular fashion. The skin was dressed with Steri-Strips tape and gauze.  The patient was taken to the recovery room in stable condition.   PLAN OF CARE: Discharge to home after PACU  PATIENT DISPOSITION:  PACU - hemodynamically stable.   Delay start of Pharmacological VTE agent (>24hrs) due to surgical blood loss or risk of bleeding: not applicable

## 2014-11-30 NOTE — Discharge Instructions (Signed)
[facility]  Ventral HERNIA REPAIR: POST OP INSTRUCTIONS  Always review your discharge instruction sheet given to you by the facility where your surgery was performed. IF YOU HAVE DISABILITY OR FAMILY LEAVE FORMS, YOU MUST BRING THEM TO THE OFFICE FOR PROCESSING.   DO NOT GIVE THEM TO YOUR DOCTOR.  1. A  prescription for pain medication may be given to you upon discharge.  Take your pain medication as prescribed, if needed.  If narcotic pain medicine is not needed, then you may take acetaminophen (Tylenol) or ibuprofen (Advil) as needed. 2. Take your usually prescribed medications unless otherwise directed. 3. If you need a refill on your pain medication, please contact your pharmacy.  They will contact our office to request authorization. Prescriptions will not be filled after 5 pm or on week-ends. 4. You should follow a light diet the first 24 hours after arrival home, such as soup and crackers, etc.  Be sure to include lots of fluids daily.  Resume your normal diet the day after surgery. 5. Most patients will experience some swelling and bruising around the umbilicus or in the groin and scrotum.  Ice packs and reclining will help.  Swelling and bruising can take several days to resolve.  6. It is common to experience some constipation if taking pain medication after surgery.  Increasing fluid intake and taking a stool softener (such as Colace) will usually help or prevent this problem from occurring.  A mild laxative (Milk of Magnesia or Miralax) should be taken according to package directions if there are no bowel movements after 48 hours. 7. Unless discharge instructions indicate otherwise, you may remove your bandages 24-48 hours after surgery, and you may shower at that time.  You may have steri-strips (small skin tapes) in place directly over the incision.  These strips should be left on the skin for 7-10 days.  If your surgeon used skin glue on the incision, you may  shower in 24 hours.  The glue will flake off over the next 2-3 weeks.  Any sutures or staples will be removed at the office during your follow-up visit. 8. ACTIVITIES:  You may resume regular (light) daily activities beginning the next day--such as daily self-care, walking, climbing stairs--gradually increasing activities as tolerated.  You may have sexual intercourse when it is comfortable.  Refrain from any heavy lifting or straining until approved by your doctor. a. You may drive when you are no longer taking prescription pain medication, you can comfortably wear a seatbelt, and you can safely maneuver your car and apply brakes. b. RETURN TO WORK:  __________________________________________________________ 9. You should see your doctor in the office for a follow-up appointment approximately 2-3 weeks after your surgery.  Make sure that you call for this appointment within a day or two after you arrive home to insure a convenient appointment time. 10. OTHER INSTRUCTIONS:  __________________________________________________________________________________________________________________________________________________________________________________________  WHEN TO CALL YOUR DOCTOR: 1. Fever over 101.0 2. Inability to urinate 3. Nausea and/or vomiting 4. Extreme swelling or bruising 5. Continued bleeding from incision. 6. Increased pain, redness, or drainage from the incision  The clinic staff is available to answer your questions during regular business hours.  Please dont hesitate to call and ask to speak to one of the nurses for clinical concerns.  If you have a medical emergency, go to the nearest emergency room or call 911.  A surgeon from [facility] is always on call at the hospital   [address], [address], [place], [place]  [address] ? ° [address] [address], [place], [place]   [address] °[phone] ? [phone] ? FAX [phone] °Web site:  [email or web address] ° °

## 2014-11-30 NOTE — Transfer of Care (Signed)
Immediate Anesthesia Transfer of Care Note  Patient: Margaret Bishop  Procedure(s) Performed: Procedure(s): LAPAROSCOPIC UMBILICAL HERNIA REPAIR WITH MESH (N/A) INSERTION OF MESH (N/A)  Patient Location: PACU  Anesthesia Type:General  Level of Consciousness: awake, alert  and oriented  Airway & Oxygen Therapy: Patient Spontanous Breathing and Patient connected to nasal cannula oxygen  Post-op Assessment: Report given to RN, Post -op Vital signs reviewed and stable and Patient moving all extremities  Post vital signs: Reviewed and stable  Last Vitals:  Filed Vitals:   11/30/14 0748  BP: 162/74  Pulse: 75  Temp: 37.4 C  Resp: 16    Complications: No apparent anesthesia complications

## 2014-11-30 NOTE — Anesthesia Procedure Notes (Signed)
Procedure Name: Intubation Date/Time: 11/30/2014 9:39 AM Performed by: Trixie Deis A Pre-anesthesia Checklist: Patient identified, Timeout performed, Emergency Drugs available, Suction available and Patient being monitored Patient Re-evaluated:Patient Re-evaluated prior to inductionOxygen Delivery Method: Circle system utilized Preoxygenation: Pre-oxygenation with 100% oxygen Intubation Type: IV induction Ventilation: Mask ventilation without difficulty and Oral airway inserted - appropriate to patient size Laryngoscope Size: Mac and 3 Grade View: Grade I Tube type: Oral Tube size: 7.0 mm Number of attempts: 1 Airway Equipment and Method: Stylet Placement Confirmation: ETT inserted through vocal cords under direct vision,  breath sounds checked- equal and bilateral and positive ETCO2 Secured at: 22 cm Tube secured with: Tape Dental Injury: Teeth and Oropharynx as per pre-operative assessment

## 2014-12-01 ENCOUNTER — Encounter (HOSPITAL_COMMUNITY): Payer: Self-pay | Admitting: General Surgery

## 2014-12-01 NOTE — Anesthesia Postprocedure Evaluation (Signed)
  Anesthesia Post-op Note  Patient: Margaret Bishop  Procedure(s) Performed: Procedure(s): LAPAROSCOPIC UMBILICAL HERNIA REPAIR WITH MESH (N/A) INSERTION OF MESH (N/A)  Patient Location: PACU  Anesthesia Type:General  Level of Consciousness: awake  Airway and Oxygen Therapy: Patient Spontanous Breathing  Post-op Pain: mild  Post-op Assessment: Post-op Vital signs reviewed, Patient's Cardiovascular Status Stable, Respiratory Function Stable, Patent Airway, No signs of Nausea or vomiting and Pain level controlled              Post-op Vital Signs: Reviewed and stable  Last Vitals:  Filed Vitals:   11/30/14 1600  BP: 118/80  Pulse: 78  Temp: 36.7 C  Resp:     Complications: No apparent anesthesia complications

## 2015-03-01 DIAGNOSIS — I10 Essential (primary) hypertension: Secondary | ICD-10-CM | POA: Diagnosis not present

## 2015-03-01 DIAGNOSIS — E119 Type 2 diabetes mellitus without complications: Secondary | ICD-10-CM | POA: Diagnosis not present

## 2015-03-01 DIAGNOSIS — Z23 Encounter for immunization: Secondary | ICD-10-CM | POA: Diagnosis not present

## 2015-05-04 DIAGNOSIS — I10 Essential (primary) hypertension: Secondary | ICD-10-CM | POA: Diagnosis not present

## 2015-05-04 DIAGNOSIS — E782 Mixed hyperlipidemia: Secondary | ICD-10-CM | POA: Diagnosis not present

## 2015-05-10 ENCOUNTER — Telehealth: Payer: Self-pay

## 2015-05-10 NOTE — Telephone Encounter (Signed)
Pt received triage letter from DS. Please call [phone]

## 2015-05-10 NOTE — Telephone Encounter (Signed)
LMOM for a return call.  

## 2015-05-24 NOTE — Telephone Encounter (Signed)
Pt is on coumadin and has been scheduled an OV appt for 06/06/2015 at 11:00 with Walden Field, NP.

## 2015-06-06 ENCOUNTER — Encounter: Payer: Self-pay | Admitting: Nurse Practitioner

## 2015-06-06 ENCOUNTER — Telehealth: Payer: Self-pay

## 2015-06-06 ENCOUNTER — Ambulatory Visit (INDEPENDENT_AMBULATORY_CARE_PROVIDER_SITE_OTHER): Payer: Medicare Other | Admitting: Nurse Practitioner

## 2015-06-06 VITALS — BP 153/81 | HR 76 | Temp 97.3°F | Ht 67.0 in | Wt 231.2 lb

## 2015-06-06 DIAGNOSIS — Z7901 Long term (current) use of anticoagulants: Secondary | ICD-10-CM | POA: Diagnosis not present

## 2015-06-06 DIAGNOSIS — Z1211 Encounter for screening for malignant neoplasm of colon: Secondary | ICD-10-CM | POA: Diagnosis not present

## 2015-06-06 NOTE — Telephone Encounter (Signed)
I have faxed note to Dr. Criss Rosales for advise on holding coumadin for colonoscopy and if she will need Lovenox Bridge.

## 2015-06-06 NOTE — Assessment & Plan Note (Signed)
Patient overdue for first-ever screening colonoscopy. She is generally asymptomatic from a GI standpoint. She is on long-term Coumadin for history of DVT, this is managed by her primary care at [facility] clinic. We will send a message to them regarding green light to hold Coumadin for 3 days prior to procedure and if there is a need for Lovenox bridge. We will schedule her for a tentative date. Return for follow-up based on postprocedure recommendations.

## 2015-06-06 NOTE — Progress Notes (Signed)
cc'ed to pcp °

## 2015-06-06 NOTE — Progress Notes (Signed)
Primary Care Physician:  Elyn Peers, MD Primary Gastroenterologist:  Dr. Oneida Alar  Chief Complaint  Patient presents with  . set up TCS    HPI:   Margaret Bishop is a 65 y.o. female who presents On referral from primary care for first-ever colonoscopy. Last PCP visit on 05/04/2015 to establish care with a new primary care. She is on Coumadin chronically for history of pulmonary embolism, per PCP notes. Patient is never had a colonoscopy and she is now willing to have one done. Labs including CBC and CMP were all normal.  Today she states she's doing well overall, has never had a colonoscopy before. Is on coumadin for a history of DVT. She has been on it for 10 years, likely indefinitely. Her PCP manages her coumadin. Denies abdominal pain, N/V, hematochezia, melena, fever, chills, unintentional weight loss, acute changes in bowel habits. Has a bowel movement daily consistent with [place] 4. History of GERD, well controlled with Prilosec. Denies chest pain, dyspnea, dizziness, lightheadedness, syncope, near syncope. Denies any other upper or lower GI symptoms.  Denies sleep apnea.  Past Medical History  Diagnosis Date  . Hypertension   . Shortness of breath dyspnea     with exertion  . History of kidney stones   . GERD (gastroesophageal reflux disease)   . Arthritis   . Hx of blood clots     Left leg  . Bipolar disorder ([place])   . Abdominal hernia     Past Surgical History  Procedure Laterality Date  . Cholecystectomy    . Knee arthroplasty Left   . Umbilical hernia repair N/A 11/30/2014    Procedure: LAPAROSCOPIC UMBILICAL HERNIA REPAIR WITH MESH;  Surgeon: Ralene Ok, MD;  Location: [facility];  Service: General;  Laterality: N/A;  . Insertion of mesh N/A 11/30/2014    Procedure: INSERTION OF MESH;  Surgeon: Ralene Ok, MD;  Location: [facility];  Service: General;  Laterality: N/A;    Current Outpatient Prescriptions  Medication Sig Dispense Refill  . acetaminophen  (TYLENOL) 500 MG tablet Take 500 mg by mouth as needed for mild pain.    Marland Kitchen albuterol (PROVENTIL HFA;VENTOLIN HFA) 108 (90 BASE) MCG/ACT inhaler Inhale 1 puff into the lungs as needed for wheezing.    Marland Kitchen amLODipine (NORVASC) 5 MG tablet Take 5 mg by mouth daily.    Marland Kitchen amLODipine-olmesartan (AZOR) 5-40 MG per tablet Take 1 tablet by mouth daily.    Marland Kitchen lamoTRIgine (LAMICTAL) 25 MG tablet Take 25 mg by mouth daily.    Marland Kitchen lithium 300 MG tablet Take 300 mg by mouth 3 (three) times daily.    Marland Kitchen omeprazole (PRILOSEC) 20 MG capsule Take 20 mg by mouth daily.    . vitamin B-12 (CYANOCOBALAMIN) 500 MCG tablet Take 500 mcg by mouth daily.    Marland Kitchen warfarin (COUMADIN) 4 MG tablet Take 4 mg by mouth daily.     No current facility-administered medications for this visit.    Allergies as of 06/06/2015 - Review Complete 06/06/2015  Allergen Reaction Noted  . Onion Hives 07/02/2012  . Strawberry extract Hives 07/02/2012  . Tomato Hives 07/02/2012    Family History  Problem Relation Age of Onset  . Colon cancer Neg Hx     Social History   Social History  . Marital Status: Single    Spouse Name: N/A  . Number of Children: N/A  . Years of Education: N/A   Occupational History  . Not on file.   Social  History Main Topics  . Smoking status: Never Smoker   . Smokeless tobacco: Never Used  . Alcohol Use: No  . Drug Use: No  . Sexual Activity: Not on file   Other Topics Concern  . Not on file   Social History Narrative    Review of Systems: 10-point ROS negative except as per HPI.    Physical Exam: BP 153/81 mmHg  Pulse 76  Temp(Src) 97.3 F (36.3 C)  Ht 5\' 7"  (1.702 m)  Wt 231 lb 3.2 oz (104.872 kg)  BMI 36.20 kg/m2 General:   Obese female, alert and oriented. Pleasant and cooperative. Well-nourished and well-developed.  Head:  Normocephalic and atraumatic. Eyes:  Without icterus, sclera clear and conjunctiva pink.  Ears:  Normal auditory acuity. Cardiovascular:  S1, S2 present  without murmurs appreciated. Extremities without clubbing or edema. Respiratory:  Clear to auscultation bilaterally. No wheezes, rales, or rhonchi. No distress.  Gastrointestinal:  +BS, rounded but soft, non-tender and non-distended. No HSM noted. No guarding or rebound. No masses appreciated.  Rectal:  Deferred  Musculoskalatal:  Symmetrical without gross deformities. Neurologic:  Alert and oriented x4;  grossly normal neurologically. Psych:  Alert and cooperative. Normal mood and affect. Heme/Lymph/Immune: No excessive bruising noted.    06/06/2015 10:53 AM   Disclaimer: This note was dictated with voice recognition software. Similar sounding words can inadvertently be transcribed and may not be corrected upon review.

## 2015-06-06 NOTE — Patient Instructions (Signed)
1. We will tentatively schedule your procedure for you. 2. We will contact Dr. Criss Rosales regarding your Coumadin. 3. We'll may have direction from them, we will communicate instructions to you on how to manage her Coumadin leading up to your procedure. 4. Return for follow-up based on postprocedure recommendations.

## 2015-06-06 NOTE — Assessment & Plan Note (Signed)
Long-term Coumadin for DVT history, we will message primary care who manages her Coumadin for the okay to hold 3 days prior to procedure and if there is a need for Lovenox bridge. We will provide her instructions based on PCP recommendations.

## 2015-06-14 NOTE — Telephone Encounter (Signed)
Re-faxed the Request.

## 2015-06-16 ENCOUNTER — Other Ambulatory Visit: Payer: Self-pay

## 2015-06-16 DIAGNOSIS — Z1211 Encounter for screening for malignant neoplasm of colon: Secondary | ICD-10-CM

## 2015-06-16 MED ORDER — PEG 3350-KCL-NA BICARB-NACL 420 G PO SOLR: 4000.0000 mL | Freq: Once | ORAL | Status: DC

## 2015-06-16 NOTE — Telephone Encounter (Signed)
Received the fax back, ok to hold coumadin and No to Lovenox Bridge. Placed on [employer].

## 2015-06-16 NOTE — Telephone Encounter (Signed)
Received the fax back with OK and sig but no answer in reference to the Lovenox Bridge. I called the office and spoke to [facility], who said Dr. Criss Rosales is with a pt. She has a copy of the fax and will have her respond and fax back to me.

## 2015-06-16 NOTE — Telephone Encounter (Signed)
Fax received from PCP. Ok to hold coumadin for 4 days prior, no lovenox bridge.  Procedure scheduled for Monday, confirm date/time, no more coumadin starting today. Call with any questions.

## 2015-06-16 NOTE — Telephone Encounter (Signed)
Pt is aware and just pick up her instructions from the office

## 2015-06-20 ENCOUNTER — Encounter (HOSPITAL_COMMUNITY)
Admission: RE | Disposition: A | Discharge status: Home / Self Care | Payer: Self-pay | Source: Ambulatory Visit | Attending: Gastroenterology

## 2015-06-20 ENCOUNTER — Encounter (HOSPITAL_COMMUNITY): Payer: Self-pay | Admitting: *Deleted

## 2015-06-20 ENCOUNTER — Ambulatory Visit (HOSPITAL_COMMUNITY)
Admission: RE | Admit: 2015-06-20 | Discharge: 2015-06-20 | Disposition: A | Discharge status: Home / Self Care | Payer: Medicare Other | Source: Ambulatory Visit | Attending: Gastroenterology | Admitting: Gastroenterology

## 2015-06-20 DIAGNOSIS — D122 Benign neoplasm of ascending colon: Secondary | ICD-10-CM

## 2015-06-20 DIAGNOSIS — Z79899 Other long term (current) drug therapy: Secondary | ICD-10-CM | POA: Diagnosis not present

## 2015-06-20 DIAGNOSIS — K219 Gastro-esophageal reflux disease without esophagitis: Secondary | ICD-10-CM | POA: Insufficient documentation

## 2015-06-20 DIAGNOSIS — Z1211 Encounter for screening for malignant neoplasm of colon: Secondary | ICD-10-CM | POA: Insufficient documentation

## 2015-06-20 DIAGNOSIS — Z7901 Long term (current) use of anticoagulants: Secondary | ICD-10-CM | POA: Insufficient documentation

## 2015-06-20 DIAGNOSIS — Z86718 Personal history of other venous thrombosis and embolism: Secondary | ICD-10-CM | POA: Diagnosis not present

## 2015-06-20 DIAGNOSIS — D124 Benign neoplasm of descending colon: Secondary | ICD-10-CM | POA: Diagnosis not present

## 2015-06-20 DIAGNOSIS — I10 Essential (primary) hypertension: Secondary | ICD-10-CM | POA: Insufficient documentation

## 2015-06-20 DIAGNOSIS — Z538 Procedure and treatment not carried out for other reasons: Secondary | ICD-10-CM | POA: Insufficient documentation

## 2015-06-20 DIAGNOSIS — D128 Benign neoplasm of rectum: Secondary | ICD-10-CM

## 2015-06-20 DIAGNOSIS — K621 Rectal polyp: Secondary | ICD-10-CM | POA: Diagnosis not present

## 2015-06-20 DIAGNOSIS — F319 Bipolar disorder, unspecified: Secondary | ICD-10-CM | POA: Diagnosis not present

## 2015-06-20 HISTORY — PX: COLONOSCOPY: SHX5424

## 2015-06-20 SURGERY — COLONOSCOPY
Anesthesia: Moderate Sedation

## 2015-06-20 MED ORDER — MEPERIDINE HCL 100 MG/ML IJ SOLN
INTRAMUSCULAR | Status: DC
Start: 2015-06-20 — End: 2015-06-20
  Filled 2015-06-20: qty 2

## 2015-06-20 MED ORDER — STERILE WATER FOR IRRIGATION IR SOLN
Status: DC | PRN
  Administered 2015-06-20: 12:00:00

## 2015-06-20 MED ORDER — SODIUM CHLORIDE 0.9 % IV SOLN
INTRAVENOUS | Status: DC
  Administered 2015-06-20: 10:00:00 via INTRAVENOUS

## 2015-06-20 MED ORDER — MEPERIDINE HCL 100 MG/ML IJ SOLN
INTRAMUSCULAR | Status: DC | PRN
  Administered 2015-06-20: 50 mg via INTRAVENOUS
  Administered 2015-06-20 (×2): 25 mg via INTRAVENOUS

## 2015-06-20 MED ORDER — MIDAZOLAM HCL 5 MG/5ML IJ SOLN
INTRAMUSCULAR | Status: AC
  Filled 2015-06-20: qty 10

## 2015-06-20 MED ORDER — MIDAZOLAM HCL 5 MG/5ML IJ SOLN
INTRAMUSCULAR | Status: DC | PRN
  Administered 2015-06-20 (×3): 2 mg via INTRAVENOUS

## 2015-06-20 NOTE — H&P (Signed)
Primary Care Physician:  Elyn Peers, MD Primary Gastroenterologist:  Dr. Oneida Alar  Pre-Procedure History & Physical: HPI:  Margaret Bishop is a 65 y.o. female here for [place].  Past Medical History  Diagnosis Date  . Hypertension   . Shortness of breath dyspnea     with exertion  . History of kidney stones   . GERD (gastroesophageal reflux disease)   . Arthritis   . Hx of blood clots     Left leg  . Bipolar disorder ([place])   . Abdominal hernia     Past Surgical History  Procedure Laterality Date  . Cholecystectomy    . Knee arthroplasty Left   . Umbilical hernia repair N/A 11/30/2014    Procedure: LAPAROSCOPIC UMBILICAL HERNIA REPAIR WITH MESH;  Surgeon: Ralene Ok, MD;  Location: [facility];  Service: General;  Laterality: N/A;  . Insertion of mesh N/A 11/30/2014    Procedure: INSERTION OF MESH;  Surgeon: Ralene Ok, MD;  Location: [facility];  Service: General;  Laterality: N/A;    Prior to Admission medications   Medication Sig Start Date End Date Taking? Authorizing Provider  acetaminophen (TYLENOL) 500 MG tablet Take 500 mg by mouth as needed for mild pain.   Yes Historical Provider, MD  albuterol (PROVENTIL HFA;VENTOLIN HFA) 108 (90 BASE) MCG/ACT inhaler Inhale 1 puff into the lungs as needed for wheezing.   Yes Historical Provider, MD  amLODipine (NORVASC) 5 MG tablet Take 5 mg by mouth daily.   Yes Historical Provider, MD  amLODipine-olmesartan (AZOR) 5-40 MG per tablet Take 1 tablet by mouth daily.   Yes Historical Provider, MD  lamoTRIgine (LAMICTAL) 25 MG tablet Take 25 mg by mouth daily.   Yes Historical Provider, MD  lithium 300 MG tablet Take 300 mg by mouth 3 (three) times daily.   Yes Historical Provider, MD  omeprazole (PRILOSEC) 20 MG capsule Take 20 mg by mouth daily.   Yes Historical Provider, MD  polyethylene glycol-electrolytes (NULYTELY/GOLYTELY) 420 g solution Take 4,000 mLs by mouth once. 06/16/15  Yes Carlis Stable, NP  vitamin B-12  (CYANOCOBALAMIN) 500 MCG tablet Take 500 mcg by mouth daily.   Yes Historical Provider, MD  warfarin (COUMADIN) 4 MG tablet Take 4 mg by mouth daily.    Historical Provider, MD    Allergies as of 06/16/2015 - Review Complete 06/06/2015  Allergen Reaction Noted  . Onion Hives 07/02/2012  . Strawberry extract Hives 07/02/2012  . Tomato Hives 07/02/2012    Family History  Problem Relation Age of Onset  . Colon cancer Neg Hx     Social History   Social History  . Marital Status: Single    Spouse Name: N/A  . Number of Children: N/A  . Years of Education: N/A   Occupational History  . Not on file.   Social History Main Topics  . Smoking status: Never Smoker   . Smokeless tobacco: Never Used  . Alcohol Use: No  . Drug Use: No  . Sexual Activity: Not on file   Other Topics Concern  . Not on file   Social History Narrative    Review of Systems: See HPI, otherwise negative ROS   Physical Exam: BP 130/70 mmHg  Pulse 56  Temp(Src) 97.9 F (36.6 C) (Oral)  Resp 17  Ht 5\' 7"  (1.702 m)  Wt 230 lb (104.327 kg)  BMI 36.01 kg/m2  SpO2 100% General:   Alert,  pleasant and cooperative in NAD Head:  Normocephalic and  atraumatic. Neck:  Supple; Lungs:  Clear throughout to auscultation.    Heart:  Regular rate and rhythm. Abdomen:  Soft, nontender and nondistended. Normal bowel sounds, without guarding, and without rebound.   Neurologic:  Alert and  oriented x4;  grossly normal neurologically.  Impression/Plan:     SCREENING  Plan:  1. TCS TODAY

## 2015-06-20 NOTE — Op Note (Signed)
[facility] Patient Name: Margaret Bishop Procedure Date: 06/20/2015 11:22 AM MRN: [id number] Date of Birth: 07-30-1950 Attending MD: Barney Drain , MD CSN: [id number] Age: 65 Admit Type: Outpatient Procedure:                INCOMPLETE Colonoscopy with cold forcep/snare                            cautery POLYPECTOMY Indications:              Screening for colorectal malignant neoplasm Providers:                Barney Drain, MD, Janeece Riggers, RN, Georgeann Oppenheim,                            Technician Referring MD:             Lucianne Lei Medicines:                Meperidine 100 mg IV, Midazolam 6 mg IV Complications:            No immediate complications. Estimated Blood Loss:     Estimated blood loss was minimal. Procedure:                Pre-Anesthesia Assessment:                           - Prior to the procedure, a History and Physical                            was performed, and patient medications and                            allergies were reviewed. The patient's tolerance of                            previous anesthesia was also reviewed. The risks                            and benefits of the procedure and the sedation                            options and risks were discussed with the patient.                            All questions were answered, and informed consent                            was obtained. Prior Anticoagulants: The patient has                            taken no previous anticoagulant or antiplatelet                            agents. ASA Grade Assessment: II - A patient with  mild systemic disease. After reviewing the risks                            and benefits, the patient was deemed in                            satisfactory condition to undergo the procedure.                           After obtaining informed consent, the colonoscope                            was passed under direct vision. Throughout the                  procedure, the patient's blood pressure, pulse, and                            oxygen saturations were monitored continuously. The                            [id number] scope was introduced through                            the anus and advanced to the the ileocecal valve.                            The ileocecal valve and the rectum were                            photographed. The colonoscopy was technically                            difficult and complex due to significant looping.                            Successful completion of the procedure was aided by                            changing the patient to a supine position, changing                            the patient to a prone position, withdrawing and                            reinserting the scope, straightening and shortening                            the scope to obtain bowel loop reduction and                            applying abdominal pressure. The patient tolerated                            the  procedure fairly well. The quality of the bowel                            preparation was good. Scope In: 11:47:32 AM Scope Out: 12:23:55 PM Total Procedure Duration: 0 hours 36 minutes 23 seconds  Findings:      The digital rectal exam was normal.      Three sessile polyps were found in the descending colon and ascending       colon. The polyps were 5 to 7 mm in size. These polyps were removed with       a hot snare. Resection and retrieval were complete.      A 3 mm polyp was found in the rectum. The polyp was sessile. The polyp       was removed with a cold biopsy forceps. Resection and retrieval were       complete.      The sigmoid colon and transverse colon revealed significantly excessive       looping. Impression:               - Three 5 to 7 mm polyps in the descending colon                            and in the ascending colon, removed with a hot                            snare.  Resected and retrieved.                           - One 3 mm polyp in the rectum, removed with a cold                            biopsy forceps. Resected and retrieved.                           - There was significant looping of the colon. Moderate Sedation:      Moderate (conscious) sedation was administered by the endoscopy nurse       and supervised by the endoscopist. The following parameters were       monitored: oxygen saturation, heart rate, blood pressure, and response       to care. Total physician intraservice time was 53 minutes. Recommendation:           - High fiber diet.                           - Continue present medications.                           - Await pathology results.                           - Refer to a gastroenterologist in 1-3 months.                           - Patient has a contact number available for  emergencies. The signs and symptoms of potential                            delayed complications were discussed with the                            patient. Return to normal activities tomorrow.                            Written discharge instructions were provided to the                            patient.                           RE-START COUMADIN ON MAY 25.                           FOLLOW A HIGH FIBER DIET. AVOID ITEMS THAT CAUSE                            BLOATING.                           USE PREPARATION H 2 TO 4 TIMES A DAY IF NEEDED FOR                            RECTAL PRESSURE/PAIN/BLEEDING.                           Next colonoscopy in 1-3 MOS AT [facility] WITH                            FLUOROSCOPY AND AN OVERTUBE. WOULD NOT ATTEMPT                            COLONOSCOPY AT [facility] UNLESS HAD AN OVER TUBE                            AND PT LOST 40 LBS.                           - Repeat colonoscopy at the next available                            appointment because the examination was incomplete. Procedure  Code(s):        --- Professional ---                           [phone], Colonoscopy, flexible; with removal of                            tumor(s), polyp(s), or other lesion(s) by snare  technique                           [id number], 59, Colonoscopy, flexible; with biopsy,                            single or multiple                           99153, Moderate sedation services; each additional                            15 minutes intraservice time                           99153, Moderate sedation services; each additional                            15 minutes intraservice time                           99153, Moderate sedation services; each additional                            15 minutes intraservice time                           G0500, Moderate sedation services provided by the                            same physician or other qualified health care                            professional performing a gastrointestinal                            endoscopic service that sedation supports,                            requiring the presence of an independent trained                            observer to assist in the monitoring of the                            patient's level of consciousness and physiological                            status; initial 15 minutes of intra-service time;                            patient age 29 years or older (additional time may                            be reported with [phone], as appropriate) Diagnosis Code(s):        --- Professional ---  Z12.11, Encounter for screening for malignant                            neoplasm of colon                           D12.4, Benign neoplasm of descending colon                           D12.2, Benign neoplasm of ascending colon                           K62.1, Rectal polyp CPT copyright 2016 American [employer] Association. All rights reserved. The codes documented in this  report are preliminary and upon coder review may  be revised to meet current compliance requirements. Barney Drain, MD Barney Drain, MD 06/20/2015 9:38:21 PM This report has been signed electronically. Number of Addenda: 0

## 2015-06-20 NOTE — Progress Notes (Signed)
REVIEWED-NO ADDITIONAL RECOMMENDATIONS. 

## 2015-06-20 NOTE — Discharge Instructions (Signed)
YOU HAD FOUR POLYPS REMOVED. I WAS UNABLE TO COMPLETE YOUR COLONOSCOPY. I ONLY SAW 95% OF YOUR COLON BECAUSE YOU HAVE AN EXTREMELY FLOPPY COLON.   RE-START COUMADIN ON MAY 25.  FOLLOW A HIGH FIBER DIET. AVOID ITEMS THAT CAUSE BLOATING. SEE INFO BELOW.  USE PREPARATION H 2 TO 4 TIMES A DAY IF NEEDED FOR RECTAL PRESSURE/PAIN/BLEEDING.  Next colonoscopy in 1-3 MOS AT [facility] WITH FLUOROSCOPY AND AN OVERTUBE. I WOULD NOT ATTEMPT YOUR COLON AT [facility] UNLESS WE HAD AN OVER TUBE AND YOU LOST 40 LBS.   Colonoscopy Care After Read the instructions outlined below and refer to this sheet in the next week. These discharge instructions provide you with general information on caring for yourself after you leave the hospital. While your treatment has been planned according to the most current medical practices available, unavoidable complications occasionally occur. If you have any problems or questions after discharge, call DR. Edgel Degnan, [phone].  ACTIVITY  You may resume your regular activity, but move at a slower pace for the next 24 hours.   Take frequent rest periods for the next 24 hours.   Walking will help get rid of the air and reduce the bloated feeling in your belly (abdomen).   No driving for 24 hours (because of the medicine (anesthesia) used during the test).   You may shower.   Do not sign any important legal documents or operate any machinery for 24 hours (because of the anesthesia used during the test).    NUTRITION  Drink plenty of fluids.   You may resume your normal diet as instructed by your doctor.   Begin with a light meal and progress to your normal diet. Heavy or fried foods are harder to digest and may make you feel sick to your stomach (nauseated).   Avoid alcoholic beverages for 24 hours or as instructed.    MEDICATIONS  You may resume your normal medications.   WHAT YOU CAN EXPECT TODAY  Some feelings of bloating in the abdomen.   Passage of  more gas than usual.   Spotting of blood in your stool or on the toilet paper  .  IF YOU HAD POLYPS REMOVED DURING THE COLONOSCOPY:  Eat a soft diet IF YOU HAVE NAUSEA, BLOATING, ABDOMINAL PAIN, OR VOMITING.    FINDING OUT THE RESULTS OF YOUR TEST Not all test results are available during your visit. DR. Oneida Alar WILL CALL YOU WITHIN 14 DAYS OF YOUR PROCEDUE WITH YOUR RESULTS. Do not assume everything is normal if you have not heard from DR. Vincent Ehrler CALL HER OFFICE AT [phone].  SEEK IMMEDIATE MEDICAL ATTENTION AND CALL THE OFFICE: [phone] IF:  You have more than a spotting of blood in your stool.   Your belly is swollen (abdominal distention).   You are nauseated or vomiting.   You have a temperature over 101F.   You have abdominal pain or discomfort that is severe or gets worse throughout the day.  High-Fiber Diet A high-fiber diet changes your normal diet to include more whole grains, legumes, fruits, and vegetables. Changes in the diet involve replacing refined carbohydrates with unrefined foods. The calorie level of the diet is essentially unchanged. The Dietary Reference Intake (recommended amount) for adult males is 38 grams per day. For adult females, it is 25 grams per day. Pregnant and lactating women should consume 28 grams of fiber per day. Fiber is the intact part of a plant that is not broken down during digestion. Functional fiber  is fiber that has been isolated from the plant to provide a beneficial effect in the body. PURPOSE  Increase stool bulk.   Ease and regulate bowel movements.   Lower cholesterol.  REDUCE RISK OF COLON CANCER  INDICATIONS THAT YOU NEED MORE FIBER  Constipation and hemorrhoids.   Uncomplicated diverticulosis (intestine condition) and irritable bowel syndrome.   Weight management.   As a protective measure against hardening of the arteries (atherosclerosis), diabetes, and cancer.   GUIDELINES FOR INCREASING FIBER IN THE  DIET  Start adding fiber to the diet slowly. A gradual increase of about 5 more grams (2 slices of whole-wheat bread, 2 servings of most fruits or vegetables, or 1 bowl of high-fiber cereal) per day is best. Too rapid an increase in fiber may result in constipation, flatulence, and bloating.   Drink enough water and fluids to keep your urine clear or pale yellow. Water, juice, or caffeine-free drinks are recommended. Not drinking enough fluid may cause constipation.   Eat a variety of high-fiber foods rather than one type of fiber.   Try to increase your intake of fiber through using high-fiber foods rather than fiber pills or supplements that contain small amounts of fiber.   The goal is to change the types of food eaten. Do not supplement your present diet with high-fiber foods, but replace foods in your present diet.   INCLUDE A VARIETY OF FIBER SOURCES  Replace refined and processed grains with whole grains, canned fruits with fresh fruits, and incorporate other fiber sources. White rice, white breads, and most bakery goods contain little or no fiber.   Brown whole-grain rice, buckwheat oats, and many fruits and vegetables are all good sources of fiber. These include: broccoli, Brussels sprouts, cabbage, cauliflower, beets, sweet potatoes, white potatoes (skin on), carrots, tomatoes, eggplant, squash, berries, fresh fruits, and dried fruits.   Cereals appear to be the richest source of fiber. Cereal fiber is found in whole grains and bran. Bran is the fiber-rich outer coat of cereal grain, which is largely removed in refining. In whole-grain cereals, the bran remains. In breakfast cereals, the largest amount of fiber is found in those with "bran" in their names. The fiber content is sometimes indicated on the label.   You may need to include additional fruits and vegetables each day.   In baking, for 1 cup white flour, you may use the following substitutions:   1 cup whole-wheat flour  minus 2 tablespoons.   1/2 cup white flour plus 1/2 cup whole-wheat flour.   Polyps, Colon  A polyp is extra tissue that grows inside your body. Colon polyps grow in the large intestine. The large intestine, also called the colon, is part of your digestive system. It is a long, hollow tube at the end of your digestive tract where your body makes and stores stool. Most polyps are not dangerous. They are benign. This means they are not cancerous. But over time, some types of polyps can turn into cancer. Polyps that are smaller than a pea are usually not harmful. But larger polyps could someday become or may already be cancerous. To be safe, doctors remove all polyps and test them.   PREVENTION There is not one sure way to prevent polyps. You might be able to lower your risk of getting them if you:  Eat more fruits and vegetables and less fatty food.   Do not smoke.   Avoid alcohol.   Exercise every day.   Lose weight if  you are overweight.   Eating more calcium and folate can also lower your risk of getting polyps. Some foods that are rich in calcium are milk, cheese, and broccoli. Some foods that are rich in folate are chickpeas, kidney beans, and spinach.

## 2015-06-23 ENCOUNTER — Encounter (HOSPITAL_COMMUNITY): Payer: Self-pay | Admitting: Gastroenterology

## 2015-06-30 ENCOUNTER — Telehealth: Payer: Self-pay | Admitting: Gastroenterology

## 2015-06-30 NOTE — Telephone Encounter (Signed)
Please call pt. She had THREE simple adenomas AND ONE HYPERPLASTIC POLYP removed.   YOU SHOULD HAVE RE-STARTED COUMADIN ON MAY 25.  FOLLOW A HIGH FIBER DIET. AVOID ITEMS THAT CAUSE BLOATING.   USE PREPARATION H 2 TO 4 TIMES A DAY IF NEEDED FOR RECTAL PRESSURE/PAIN/BLEEDING.  Next colonoscopy in 1-3 MOS AT [facility] WITH FLUOROSCOPY AND AN OVERTUBE. I WOULD NOT ATTEMPT YOUR COLON AT [place] AGAIN UNLESS WE HAD AN OVERTUBE AND YOU LOST 40 LBS.

## 2015-06-30 NOTE — Telephone Encounter (Signed)
L/M to call.

## 2015-06-30 NOTE — Telephone Encounter (Signed)
PT is aware. OK to refer to [facility].

## 2015-07-01 ENCOUNTER — Other Ambulatory Visit: Payer: Self-pay

## 2015-07-01 DIAGNOSIS — Z9889 Other specified postprocedural states: Secondary | ICD-10-CM

## 2015-07-04 NOTE — Telephone Encounter (Signed)
Referral sent 

## 2015-07-14 DIAGNOSIS — I2699 Other pulmonary embolism without acute cor pulmonale: Secondary | ICD-10-CM | POA: Diagnosis not present

## 2015-07-14 DIAGNOSIS — E119 Type 2 diabetes mellitus without complications: Secondary | ICD-10-CM | POA: Diagnosis not present

## 2015-07-14 DIAGNOSIS — I10 Essential (primary) hypertension: Secondary | ICD-10-CM | POA: Diagnosis not present

## 2015-07-30 HISTORY — PX: COLONOSCOPY: SHX174

## 2015-08-19 DIAGNOSIS — Z7901 Long term (current) use of anticoagulants: Secondary | ICD-10-CM | POA: Insufficient documentation

## 2015-08-19 DIAGNOSIS — Z9189 Other specified personal risk factors, not elsewhere classified: Secondary | ICD-10-CM | POA: Insufficient documentation

## 2015-08-19 DIAGNOSIS — Z86718 Personal history of other venous thrombosis and embolism: Secondary | ICD-10-CM | POA: Insufficient documentation

## 2015-08-26 DIAGNOSIS — I10 Essential (primary) hypertension: Secondary | ICD-10-CM | POA: Diagnosis not present

## 2015-08-26 DIAGNOSIS — Z86010 Personal history of colonic polyps: Secondary | ICD-10-CM | POA: Diagnosis not present

## 2015-08-26 DIAGNOSIS — Z7982 Long term (current) use of aspirin: Secondary | ICD-10-CM | POA: Diagnosis not present

## 2015-08-26 DIAGNOSIS — K219 Gastro-esophageal reflux disease without esophagitis: Secondary | ICD-10-CM | POA: Diagnosis not present

## 2015-08-26 DIAGNOSIS — J302 Other seasonal allergic rhinitis: Secondary | ICD-10-CM | POA: Diagnosis not present

## 2015-08-26 DIAGNOSIS — Z7901 Long term (current) use of anticoagulants: Secondary | ICD-10-CM | POA: Diagnosis not present

## 2015-08-26 DIAGNOSIS — Z1211 Encounter for screening for malignant neoplasm of colon: Secondary | ICD-10-CM | POA: Diagnosis not present

## 2015-08-26 DIAGNOSIS — Z91018 Allergy to other foods: Secondary | ICD-10-CM | POA: Diagnosis not present

## 2015-08-26 DIAGNOSIS — Z86718 Personal history of other venous thrombosis and embolism: Secondary | ICD-10-CM | POA: Diagnosis not present

## 2015-08-26 DIAGNOSIS — Z79899 Other long term (current) drug therapy: Secondary | ICD-10-CM | POA: Diagnosis not present

## 2015-08-26 DIAGNOSIS — Z91013 Allergy to seafood: Secondary | ICD-10-CM | POA: Diagnosis not present

## 2015-08-26 DIAGNOSIS — Z9889 Other specified postprocedural states: Secondary | ICD-10-CM | POA: Diagnosis not present

## 2015-11-11 ENCOUNTER — Ambulatory Visit (HOSPITAL_COMMUNITY)
Admission: RE | Admit: 2015-11-11 | Discharge: 2015-11-11 | Disposition: A | Discharge status: Home / Self Care | Payer: Medicare Other | Source: Ambulatory Visit | Attending: Family Medicine | Admitting: Family Medicine

## 2015-11-11 ENCOUNTER — Other Ambulatory Visit (HOSPITAL_COMMUNITY): Payer: Self-pay | Admitting: Family Medicine

## 2015-11-11 DIAGNOSIS — M12879 Other specific arthropathies, not elsewhere classified, unspecified ankle and foot: Secondary | ICD-10-CM | POA: Diagnosis not present

## 2015-11-11 DIAGNOSIS — M7989 Other specified soft tissue disorders: Secondary | ICD-10-CM | POA: Diagnosis not present

## 2015-11-11 DIAGNOSIS — E119 Type 2 diabetes mellitus without complications: Secondary | ICD-10-CM | POA: Diagnosis not present

## 2015-11-11 DIAGNOSIS — I10 Essential (primary) hypertension: Secondary | ICD-10-CM | POA: Diagnosis not present

## 2015-11-11 DIAGNOSIS — M25571 Pain in right ankle and joints of right foot: Secondary | ICD-10-CM | POA: Diagnosis present

## 2015-12-02 DIAGNOSIS — M1000 Idiopathic gout, unspecified site: Secondary | ICD-10-CM | POA: Diagnosis not present

## 2015-12-02 DIAGNOSIS — E119 Type 2 diabetes mellitus without complications: Secondary | ICD-10-CM | POA: Diagnosis not present

## 2015-12-02 DIAGNOSIS — I10 Essential (primary) hypertension: Secondary | ICD-10-CM | POA: Diagnosis not present

## 2015-12-16 DIAGNOSIS — M1000 Idiopathic gout, unspecified site: Secondary | ICD-10-CM | POA: Diagnosis not present

## 2015-12-16 DIAGNOSIS — I10 Essential (primary) hypertension: Secondary | ICD-10-CM | POA: Diagnosis not present

## 2016-02-20 DIAGNOSIS — J209 Acute bronchitis, unspecified: Secondary | ICD-10-CM | POA: Diagnosis not present

## 2016-04-19 ENCOUNTER — Other Ambulatory Visit (HOSPITAL_COMMUNITY): Payer: Self-pay | Admitting: Family Medicine

## 2016-04-19 DIAGNOSIS — R4702 Dysphasia: Secondary | ICD-10-CM

## 2016-04-24 ENCOUNTER — Ambulatory Visit (HOSPITAL_COMMUNITY)
Admission: RE | Admit: 2016-04-24 | Discharge: 2016-04-24 | Disposition: A | Discharge status: Home / Self Care | Payer: Medicare Other | Source: Ambulatory Visit | Attending: Family Medicine | Admitting: Family Medicine

## 2016-04-24 DIAGNOSIS — R4702 Dysphasia: Secondary | ICD-10-CM | POA: Diagnosis not present

## 2016-05-04 DIAGNOSIS — M1000 Idiopathic gout, unspecified site: Secondary | ICD-10-CM | POA: Diagnosis not present

## 2016-05-04 DIAGNOSIS — I10 Essential (primary) hypertension: Secondary | ICD-10-CM | POA: Diagnosis not present

## 2016-08-14 DIAGNOSIS — E119 Type 2 diabetes mellitus without complications: Secondary | ICD-10-CM | POA: Diagnosis not present

## 2016-08-14 DIAGNOSIS — M109 Gout, unspecified: Secondary | ICD-10-CM | POA: Diagnosis not present

## 2016-08-14 DIAGNOSIS — E11621 Type 2 diabetes mellitus with foot ulcer: Secondary | ICD-10-CM | POA: Diagnosis not present

## 2016-08-14 DIAGNOSIS — I10 Essential (primary) hypertension: Secondary | ICD-10-CM | POA: Diagnosis not present

## 2016-08-21 ENCOUNTER — Ambulatory Visit (INDEPENDENT_AMBULATORY_CARE_PROVIDER_SITE_OTHER): Payer: Medicare Other | Admitting: Podiatry

## 2016-08-21 ENCOUNTER — Encounter: Payer: Self-pay | Admitting: Podiatry

## 2016-08-21 VITALS — BP 151/77 | HR 77

## 2016-08-21 DIAGNOSIS — B351 Tinea unguium: Secondary | ICD-10-CM | POA: Diagnosis not present

## 2016-08-21 DIAGNOSIS — M79676 Pain in unspecified toe(s): Secondary | ICD-10-CM | POA: Diagnosis not present

## 2016-08-21 NOTE — Progress Notes (Signed)
   Subjective:    Patient ID: Margaret Bishop, female    DOB: Nov 15, 1950, 66 y.o.   MRN: [id number]  HPI this patient presents the office with chief complaint of a painful callus under the outside ball of her right foot.  She says it is painful for the last 3 months.  She says that it developed when she wore her sneakers and it has become more painful ever since.  She has been unable to provide any self treatment for this condition.  She also says she has knee problem on her left side, which has caused her to be unable to take care of her nails.  Her nails have become thick and long and painful walking and wearing her shoes . She presents the office today for an evaluation and treatment of her painful feet    Review of Systems  All other systems reviewed and are negative.      Objective:   Physical Exam GENERAL APPEARANCE: Alert, conversant. Appropriately groomed. No acute distress.  VASCULAR: Pedal pulses are  palpable at  DP  bilateral. PT is non palpable secondary to swelling. Capillary refill time is immediate to all digits,  Normal temperature gradient.   NEUROLOGIC: sensation is normal to 5.07 monofilament at 5/5 sites bilateral.  Light touch is intact bilateral, Muscle strength normal.  MUSCULOSKELETAL: acceptable muscle strength, tone and stability bilateral.  Intrinsic muscluature intact bilateral.  Rectus appearance of foot and digits noted bilateral.   DERMATOLOGIC: skin color, texture, and turgor are within normal limits.  No preulcerative lesions or ulcers  are seen, no interdigital maceration noted.  No open lesions present.   No drainage noted. Porokeratosis sub 5th right         Assessment & Plan:  Porokeratosis  Right foot.  Onychomycosis  B/L  X 10  IE  Debridement of nails bilaterally.  [place] of porokeratotic lesion right foot.  Return to clinic in 3 months   Gardiner Barefoot [facility]

## 2016-09-25 DIAGNOSIS — Z0000 Encounter for general adult medical examination without abnormal findings: Secondary | ICD-10-CM | POA: Diagnosis not present

## 2016-10-09 ENCOUNTER — Other Ambulatory Visit (HOSPITAL_COMMUNITY): Payer: Self-pay | Admitting: Family Medicine

## 2016-10-09 DIAGNOSIS — Z1231 Encounter for screening mammogram for malignant neoplasm of breast: Secondary | ICD-10-CM

## 2016-10-15 ENCOUNTER — Ambulatory Visit (HOSPITAL_COMMUNITY)
Admission: RE | Admit: 2016-10-15 | Discharge: 2016-10-15 | Disposition: A | Discharge status: Home / Self Care | Payer: Medicare Other | Source: Ambulatory Visit | Attending: Family Medicine | Admitting: Family Medicine

## 2016-10-15 ENCOUNTER — Ambulatory Visit (HOSPITAL_COMMUNITY): Payer: Self-pay

## 2016-10-15 DIAGNOSIS — Z1231 Encounter for screening mammogram for malignant neoplasm of breast: Secondary | ICD-10-CM | POA: Insufficient documentation

## 2016-11-20 ENCOUNTER — Ambulatory Visit (INDEPENDENT_AMBULATORY_CARE_PROVIDER_SITE_OTHER): Payer: Medicare Other | Admitting: Podiatry

## 2016-11-20 DIAGNOSIS — D689 Coagulation defect, unspecified: Secondary | ICD-10-CM | POA: Diagnosis not present

## 2016-11-20 DIAGNOSIS — M79676 Pain in unspecified toe(s): Secondary | ICD-10-CM

## 2016-11-20 DIAGNOSIS — B351 Tinea unguium: Secondary | ICD-10-CM

## 2016-11-20 NOTE — Progress Notes (Signed)
Complaint:  Visit Type: Patient returns to my office for continued preventative foot care services. Complaint: Patient states" my nails have grown long and thick and become painful to walk and wear shoes" Patient has been taking coumadin.. The patient presents for preventative foot care services. No changes to ROS  Podiatric Exam: Vascular: dorsalis pedis and posterior tibial pulses are palpable bilateral. Capillary return is immediate. Temperature gradient is WNL. Skin turgor WNL  Sensorium: Normal Semmes Weinstein monofilament test. Normal tactile sensation bilaterally. Nail Exam: Pt has thick disfigured discolored nails with subungual debris noted bilateral entire nail hallux through fifth toenails Ulcer Exam: There is no evidence of ulcer or pre-ulcerative changes or infection. Orthopedic Exam: Muscle tone and strength are WNL. No limitations in general ROM. No crepitus or effusions noted. Foot type and digits show no abnormalities. Bony prominences are unremarkable. Skin: No Porokeratosis. No infection or ulcers  Diagnosis:  Onychomycosis, , Pain in right toe, pain in left toes  Treatment & Plan Procedures and Treatment: Consent by patient was obtained for treatment procedures.   Debridement of mycotic and hypertrophic toenails, 1 through 5 bilateral and clearing of subungual debris. No ulceration, no infection noted.  Return Visit-Office Procedure: Patient instructed to return to the office for a follow up visit 3 months for continued evaluation and treatment.    Gardiner Barefoot DPM

## 2016-12-25 DIAGNOSIS — I10 Essential (primary) hypertension: Secondary | ICD-10-CM | POA: Diagnosis not present

## 2016-12-25 DIAGNOSIS — E11621 Type 2 diabetes mellitus with foot ulcer: Secondary | ICD-10-CM | POA: Diagnosis not present

## 2016-12-25 DIAGNOSIS — M109 Gout, unspecified: Secondary | ICD-10-CM | POA: Diagnosis not present

## 2017-02-19 ENCOUNTER — Ambulatory Visit: Payer: Medicare Other | Admitting: Podiatry

## 2017-02-19 ENCOUNTER — Encounter: Payer: Self-pay | Admitting: Podiatry

## 2017-02-19 DIAGNOSIS — B351 Tinea unguium: Secondary | ICD-10-CM

## 2017-02-19 DIAGNOSIS — M79676 Pain in unspecified toe(s): Secondary | ICD-10-CM

## 2017-02-19 DIAGNOSIS — D689 Coagulation defect, unspecified: Secondary | ICD-10-CM

## 2017-02-19 NOTE — Progress Notes (Signed)
Complaint:  Visit Type: Patient returns to my office for continued preventative foot care services. Complaint: Patient states" my nails have grown long and thick and become painful to walk and wear shoes" Patient has been taking coumadin.. The patient presents for preventative foot care services. No changes to ROS  Podiatric Exam: Vascular: dorsalis pedis and posterior tibial pulses are palpable bilateral. Capillary return is immediate. Temperature gradient is WNL. Skin turgor WNL  Sensorium: Normal Semmes Weinstein monofilament test. Normal tactile sensation bilaterally. Nail Exam: Pt has thick disfigured discolored nails with subungual debris noted bilateral entire nail hallux through fifth toenails Ulcer Exam: There is no evidence of ulcer or pre-ulcerative changes or infection. Orthopedic Exam: Muscle tone and strength are WNL. No limitations in general ROM. No crepitus or effusions noted. Foot type and digits show no abnormalities. Bony prominences are unremarkable. Skin: No Porokeratosis. No infection or ulcers  Diagnosis:  Onychomycosis, , Pain in right toe, pain in left toes  Treatment & Plan Procedures and Treatment: Consent by patient was obtained for treatment procedures.   Debridement of mycotic and hypertrophic toenails, 1 through 5 bilateral and clearing of subungual debris. No ulceration, no infection noted.  Return Visit-Office Procedure: Patient instructed to return to the office for a follow up visit 3 months for continued evaluation and treatment.    Gardiner Barefoot DPM

## 2017-05-22 ENCOUNTER — Encounter: Payer: Self-pay | Admitting: Podiatry

## 2017-05-22 ENCOUNTER — Ambulatory Visit: Payer: Medicare Other | Admitting: Podiatry

## 2017-05-22 DIAGNOSIS — D689 Coagulation defect, unspecified: Secondary | ICD-10-CM

## 2017-05-22 DIAGNOSIS — B351 Tinea unguium: Secondary | ICD-10-CM

## 2017-05-22 DIAGNOSIS — Q828 Other specified congenital malformations of skin: Secondary | ICD-10-CM

## 2017-05-22 DIAGNOSIS — M216X1 Other acquired deformities of right foot: Secondary | ICD-10-CM

## 2017-05-22 DIAGNOSIS — M79676 Pain in unspecified toe(s): Secondary | ICD-10-CM

## 2017-05-22 NOTE — Progress Notes (Signed)
Complaint:  Visit Type: Patient returns to my office for continued preventative foot care services. Complaint: Patient states" my nails have grown long and thick and become painful to walk and wear shoes" Patient has been taking coumadin.. The patient presents for preventative foot care services. No changes to ROS.  Patient says the callus under the outside of her right foot is painful walking.  Podiatric Exam: Vascular: dorsalis pedis and posterior tibial pulses are palpable bilateral. Capillary return is immediate. Temperature gradient is WNL. Skin turgor WNL  Sensorium: Normal Semmes Weinstein monofilament test. Normal tactile sensation bilaterally. Nail Exam: Pt has thick disfigured discolored nails with subungual debris noted bilateral entire nail hallux through fifth toenails Ulcer Exam: There is no evidence of ulcer or pre-ulcerative changes or infection. Orthopedic Exam: Muscle tone and strength are WNL. No limitations in general ROM. No crepitus or effusions noted. Foot type and digits show no abnormalities. Bony prominences are unremarkable. Skin:  Porokeratosis  Sub 5th right  . No infection or ulcers  Diagnosis:  Onychomycosis, , Pain in right toe, pain in left toes Porokeratosis  Right foot.  Treatment & Plan Procedures and Treatment: Consent by patient was obtained for treatment procedures.   Debridement of mycotic and hypertrophic toenails, 1 through 5 bilateral and clearing of subungual debris. No ulceration, no infection noted. Debride porokeratosis.  Asked patient to see Liliane Channel for foot padding in lieu of foot surgery. Return Visit-Office Procedure: Patient instructed to return to the office for a follow up visit 3 months for continued evaluation and treatment.    Gardiner Barefoot DPM

## 2017-05-23 ENCOUNTER — Ambulatory Visit (INDEPENDENT_AMBULATORY_CARE_PROVIDER_SITE_OTHER): Payer: Medicare Other | Admitting: Orthotics

## 2017-05-23 DIAGNOSIS — Q828 Other specified congenital malformations of skin: Secondary | ICD-10-CM | POA: Diagnosis not present

## 2017-05-23 NOTE — Progress Notes (Signed)
Patient came in today per Dr. Prudence Davidson for [place] to address painful keratoma sub 5th Right.  Patient was cast and f/o ordered with offload and p-cell cover.   Patient advised 398.00 charge and wants to make payments.

## 2017-06-13 ENCOUNTER — Ambulatory Visit (INDEPENDENT_AMBULATORY_CARE_PROVIDER_SITE_OTHER): Payer: Medicare Other | Admitting: Orthotics

## 2017-06-13 DIAGNOSIS — M79676 Pain in unspecified toe(s): Secondary | ICD-10-CM

## 2017-06-13 DIAGNOSIS — Q828 Other specified congenital malformations of skin: Secondary | ICD-10-CM

## 2017-06-13 DIAGNOSIS — B351 Tinea unguium: Secondary | ICD-10-CM

## 2017-06-13 NOTE — Progress Notes (Signed)
Patient came in today to pick up custom made foot orthotics.  The goals were accomplished and the patient reported no dissatisfaction with said orthotics.  Patient was advised of breakin period and how to report any issues. 

## 2017-06-27 DIAGNOSIS — E119 Type 2 diabetes mellitus without complications: Secondary | ICD-10-CM | POA: Diagnosis not present

## 2017-06-27 DIAGNOSIS — E782 Mixed hyperlipidemia: Secondary | ICD-10-CM | POA: Diagnosis not present

## 2017-06-27 DIAGNOSIS — I10 Essential (primary) hypertension: Secondary | ICD-10-CM | POA: Diagnosis not present

## 2017-07-15 DIAGNOSIS — Z0000 Encounter for general adult medical examination without abnormal findings: Secondary | ICD-10-CM | POA: Diagnosis not present

## 2017-08-12 DIAGNOSIS — E119 Type 2 diabetes mellitus without complications: Secondary | ICD-10-CM | POA: Diagnosis not present

## 2017-08-12 DIAGNOSIS — R062 Wheezing: Secondary | ICD-10-CM | POA: Diagnosis not present

## 2017-08-12 DIAGNOSIS — J301 Allergic rhinitis due to pollen: Secondary | ICD-10-CM | POA: Diagnosis not present

## 2017-08-20 ENCOUNTER — Ambulatory Visit: Payer: Medicare Other | Admitting: Podiatry

## 2017-08-21 ENCOUNTER — Ambulatory Visit (INDEPENDENT_AMBULATORY_CARE_PROVIDER_SITE_OTHER): Payer: Medicare Other

## 2017-08-21 ENCOUNTER — Encounter (HOSPITAL_COMMUNITY): Payer: Self-pay | Admitting: *Deleted

## 2017-08-21 ENCOUNTER — Ambulatory Visit: Payer: Medicare Other | Admitting: Podiatry

## 2017-08-21 ENCOUNTER — Emergency Department (HOSPITAL_COMMUNITY)
Admission: EM | Admit: 2017-08-21 | Discharge: 2017-08-21 | Disposition: A | Discharge status: Home / Self Care | Payer: Medicare Other | Attending: Emergency Medicine | Admitting: Emergency Medicine

## 2017-08-21 ENCOUNTER — Emergency Department (HOSPITAL_BASED_OUTPATIENT_CLINIC_OR_DEPARTMENT_OTHER): Payer: Medicare Other

## 2017-08-21 ENCOUNTER — Other Ambulatory Visit: Payer: Self-pay

## 2017-08-21 ENCOUNTER — Telehealth: Payer: Self-pay | Admitting: *Deleted

## 2017-08-21 ENCOUNTER — Encounter: Payer: Self-pay | Admitting: Podiatry

## 2017-08-21 DIAGNOSIS — I10 Essential (primary) hypertension: Secondary | ICD-10-CM | POA: Diagnosis not present

## 2017-08-21 DIAGNOSIS — M7989 Other specified soft tissue disorders: Secondary | ICD-10-CM | POA: Diagnosis not present

## 2017-08-21 DIAGNOSIS — M79609 Pain in unspecified limb: Secondary | ICD-10-CM

## 2017-08-21 DIAGNOSIS — Z7901 Long term (current) use of anticoagulants: Secondary | ICD-10-CM | POA: Insufficient documentation

## 2017-08-21 DIAGNOSIS — R52 Pain, unspecified: Secondary | ICD-10-CM

## 2017-08-21 DIAGNOSIS — M79604 Pain in right leg: Secondary | ICD-10-CM | POA: Diagnosis present

## 2017-08-21 DIAGNOSIS — L03115 Cellulitis of right lower limb: Secondary | ICD-10-CM

## 2017-08-21 DIAGNOSIS — R2241 Localized swelling, mass and lump, right lower limb: Secondary | ICD-10-CM | POA: Diagnosis not present

## 2017-08-21 DIAGNOSIS — R600 Localized edema: Secondary | ICD-10-CM | POA: Diagnosis not present

## 2017-08-21 DIAGNOSIS — Z79899 Other long term (current) drug therapy: Secondary | ICD-10-CM | POA: Diagnosis not present

## 2017-08-21 DIAGNOSIS — R509 Fever, unspecified: Secondary | ICD-10-CM | POA: Diagnosis not present

## 2017-08-21 DIAGNOSIS — L03119 Cellulitis of unspecified part of limb: Secondary | ICD-10-CM

## 2017-08-21 DIAGNOSIS — L03116 Cellulitis of left lower limb: Secondary | ICD-10-CM

## 2017-08-21 LAB — BASIC METABOLIC PANEL
Anion gap: 9 (ref 5–15)
BUN: 12 mg/dL (ref 8–23)
CO2: 24 mmol/L (ref 22–32)
Calcium: 9.2 mg/dL (ref 8.9–10.3)
Chloride: 111 mmol/L (ref 98–111)
Creatinine, Ser: 0.88 mg/dL (ref 0.44–1.00)
GFR calc Af Amer: >60 mL/min (ref >60)
GFR calc non Af Amer: >60 mL/min (ref >60)
Glucose, Bld: 103 mg/dL — ABNORMAL HIGH (ref 70–99)
Potassium: 3.8 mmol/L (ref 3.5–5.1)
Sodium: 144 mmol/L (ref 135–145)

## 2017-08-21 LAB — CBC WITH DIFFERENTIAL/PLATELET
Abs Immature Granulocytes: 0 10*3/uL (ref 0.0–0.1)
Basophils Absolute: 0.1 10*3/uL (ref 0.0–0.1)
Basophils Relative: 1 %
Eosinophils Absolute: 0.2 10*3/uL (ref 0.0–0.7)
Eosinophils Relative: 4 %
HCT: 39 % (ref 36.0–46.0)
Hemoglobin: 11.6 g/dL — ABNORMAL LOW (ref 12.0–15.0)
Immature Granulocytes: 1 %
Lymphocytes Relative: 29 %
Lymphs Abs: 1.7 10*3/uL (ref 0.7–4.0)
MCH: 29.4 pg (ref 26.0–34.0)
MCHC: 29.7 g/dL — ABNORMAL LOW (ref 30.0–36.0)
MCV: 98.7 fL (ref 78.0–100.0)
Monocytes Absolute: 0.7 10*3/uL (ref 0.1–1.0)
Monocytes Relative: 12 %
Neutro Abs: 3.2 10*3/uL (ref 1.7–7.7)
Neutrophils Relative %: 53 %
Platelets: 270 10*3/uL (ref 150–400)
RBC: 3.95 MIL/uL (ref 3.87–5.11)
RDW: 12.6 % (ref 11.5–15.5)
WBC: 5.9 10*3/uL (ref 4.0–10.5)

## 2017-08-21 LAB — I-STAT CG4 LACTIC ACID, ED: Lactic Acid, Venous: 1.17 mmol/L (ref 0.5–1.9)

## 2017-08-21 LAB — PROTIME-INR
INR: 1.14
Prothrombin Time: 14.5 seconds (ref 11.4–15.2)

## 2017-08-21 MED ORDER — CEPHALEXIN 500 MG PO CAPS: 500.0000 mg | ORAL_CAPSULE | Freq: Three times a day (TID) | ORAL | 0 refills | Status: DC

## 2017-08-21 MED ORDER — CEPHALEXIN 250 MG PO CAPS
500.0000 mg | ORAL_CAPSULE | Freq: Once | ORAL | Status: AC
  Administered 2017-08-21: 500 mg via ORAL
  Filled 2017-08-21: qty 2

## 2017-08-21 NOTE — Discharge Instructions (Signed)
Elevate your leg.  Stay off of it as much as possible.  Take antibiotic as prescribed.  Follow-up with your doctor in 2 days for recheck.  Return if worsening.

## 2017-08-21 NOTE — Telephone Encounter (Signed)
[facility] - Melinda's voice mail states call [phone] for emergency calls. I called the [phone] and was transferred to the lab, a voicemail recording states the lab tech was not in at the time. I informed Dr. March Rummage and pt, that she would need to go to the ED.

## 2017-08-21 NOTE — Telephone Encounter (Signed)
I informed [facility] of pt's symptoms and that she was on the way in private vehicle.

## 2017-08-21 NOTE — ED Triage Notes (Signed)
Pt in c/o right lower leg swelling and pain that started a week ago, sent here to r/o DVT, pt has history of DVT and is currently taking coumadin

## 2017-08-21 NOTE — ED Provider Notes (Signed)
[facility] EMERGENCY DEPARTMENT Provider Note   CSN: [id number] Arrival date & time: 08/21/17  1652     History   Chief Complaint Chief Complaint  Patient presents with  . Leg Swelling    HPI Margaret Bishop is a 67 y.o. female.  HPI Margaret Bishop is a 67 y.o. female with history of DVTs, hypertension, presents to emergency department with complaint of right leg pain and swelling.  She states swelling started about a week ago.  It has gradually gotten worse.  She went to her podiatrist today and was sent here to get evaluation for a possible blood clot.  She states she is currently on Coumadin.  She reports subjective fever and chills.  No injuries.  No history of swelling this severe.  No treatment at home prior to coming in.  She states nothing is making her symptoms better or worse.  Past Medical History:  Diagnosis Date  . Abdominal hernia   . Arthritis   . Bipolar disorder ([place])   . GERD (gastroesophageal reflux disease)   . History of kidney stones   . Hx of blood clots    Left leg  . Hypertension   . Shortness of breath dyspnea    with exertion    Patient Active Problem List   Diagnosis Date Noted  . Warfarin anticoagulation 06/06/2015  . Encounter for screening colonoscopy 06/06/2015    Past Surgical History:  Procedure Laterality Date  . CHOLECYSTECTOMY    . COLONOSCOPY N/A 06/20/2015   Procedure: COLONOSCOPY;  Surgeon: Danie Binder, MD;  Location: [facility];  Service: Endoscopy;  Laterality: N/A;  1100  . INSERTION OF MESH N/A 11/30/2014   Procedure: INSERTION OF MESH;  Surgeon: Ralene Ok, MD;  Location: [facility];  Service: General;  Laterality: N/A;  . KNEE ARTHROPLASTY Left   . UMBILICAL HERNIA REPAIR N/A 11/30/2014   Procedure: LAPAROSCOPIC UMBILICAL HERNIA REPAIR WITH MESH;  Surgeon: Ralene Ok, MD;  Location: [facility];  Service: General;  Laterality: N/A;     OB History   None      Home Medications    Prior to  Admission medications   Medication Sig Start Date End Date Taking? Authorizing Provider  acetaminophen (TYLENOL) 500 MG tablet Take 500 mg by mouth as needed for mild pain.    [provider]  albuterol (PROVENTIL HFA;VENTOLIN HFA) 108 (90 BASE) MCG/ACT inhaler Inhale 1 puff into the lungs as needed for wheezing.    [provider]  amLODipine (NORVASC) 5 MG tablet Take 5 mg by mouth daily.    [provider]  amLODipine-olmesartan (AZOR) 5-40 MG per tablet Take 1 tablet by mouth daily.    [provider]  lamoTRIgine (LAMICTAL) 25 MG tablet Take 25 mg by mouth daily.    [provider]  lithium 300 MG tablet Take 300 mg by mouth 3 (three) times daily.    [provider]  omeprazole (PRILOSEC) 20 MG capsule Take 20 mg by mouth daily.    [provider]  polyethylene glycol-electrolytes (NULYTELY/GOLYTELY) 420 g solution Take 4,000 mLs by mouth once. 06/16/15   Carlis Stable, NP  vitamin B-12 (CYANOCOBALAMIN) 500 MCG tablet Take 500 mcg by mouth daily.    [provider]  warfarin (COUMADIN) 4 MG tablet Take 4 mg by mouth daily.    [provider]    Family History Family History  Problem Relation Age of Onset  . Colon cancer  Neg Hx     Social History Social History   Tobacco Use  . Smoking status: Never Smoker  . Smokeless tobacco: Never Used  Substance Use Topics  . Alcohol use: No    Alcohol/week: 0.0 oz  . Drug use: No     Allergies   Onion; Shellfish allergy; Strawberry extract; and Tomato   Review of Systems Review of Systems  Constitutional: Positive for chills and fever.  Musculoskeletal: Positive for arthralgias and joint swelling.  Skin: Positive for color change.  Neurological: Negative for weakness and numbness.  All other systems reviewed and are negative.    Physical Exam Updated Vital Signs BP (!) 143/86 (BP Location: Right Arm)   Pulse 87   Temp 98.7 F (37.1 C) (Oral)    Resp 16   SpO2 97%   Physical Exam  Constitutional: She appears well-developed and well-nourished. No distress.  HENT:  Head: Normocephalic.  Eyes: Conjunctivae are normal.  Neck: Neck supple.  Cardiovascular: Normal rate, regular rhythm and normal heart sounds.  Pulmonary/Chest: Effort normal and breath sounds normal. No respiratory distress. She has no wheezes. She has no rales.  Abdominal: Soft. Bowel sounds are normal. She exhibits no distension. There is no tenderness. There is no rebound.  Musculoskeletal: She exhibits no edema.  Right leg significantly swollen compared to the left.  There is erythema circumferentially to the lower shin with warmth to touch and tenderness to palpation.  Tenderness extends from dorsal foot all the way up to the knee.  Dorsal pedal pulses intact.  No joint swelling or effusion.  Neurological: She is alert.  Skin: Skin is warm and dry.  Psychiatric: She has a normal mood and affect. Her behavior is normal.  Nursing note and vitals reviewed.    ED Treatments / Results  Labs (all labs ordered are listed, but only abnormal results are displayed) Labs Reviewed  CBC WITH DIFFERENTIAL/PLATELET - Abnormal; Notable for the following components:      Result Value   Hemoglobin 11.6 (*)    MCHC 29.7 (*)    All other components within normal limits  BASIC METABOLIC PANEL - Abnormal; Notable for the following components:   Glucose, Bld 103 (*)    All other components within normal limits  PROTIME-INR  I-STAT CG4 LACTIC ACID, ED    EKG None  Radiology No results found.  Procedures Procedures (including critical care time)  Medications Ordered in ED Medications - No data to display   Initial Impression / Assessment and Plan / ED Course  I have reviewed the triage vital signs and the nursing notes.  Pertinent labs & imaging results that were available during my care of the patient were reviewed by me and considered in my medical decision  making (see chart for details).     Right leg swelling, onset 1 week ago, worsening.  Sent here to rule out DVT.  She had a vascular ultrasound which was negative for blood clot.  On my exam, her symptoms are suspicious for cellulitis.  She is afebrile, vital signs are normal.  We will check basic labs. INR is subtherapeutic  7:51 PM  normal white blood cell count.  Normal lactic acid.  I think patient is reasonable to trial treatment as an outpatient with antibiotics.  Most likely cellulitis.  We will start her on Keflex.  No history of MRSA.  Advised to keep her leg elevated, follow-up with family doctor podiatrist. Return precautions discussed.   Vitals:   08/21/17  1702 08/21/17 1850  BP: (!) 143/86 135/74  Pulse: 87 66  Resp: 16 16  Temp: 98.7 F (37.1 C)   TempSrc: Oral   SpO2: 97% 100%     Final Clinical Impressions(s) / ED Diagnoses   Final diagnoses:  Cellulitis of right leg    ED Discharge Orders        Ordered    cephALEXin (KEFLEX) 500 MG capsule  3 times daily,   Status:  Discontinued     08/21/17 1958    cephALEXin (KEFLEX) 500 MG capsule  3 times daily     08/21/17 1959      Jeannett Senior, PA-C 08/21/17 1959  Virgel Manifold, MD 08/22/17 [phone]

## 2017-08-21 NOTE — ED Provider Notes (Addendum)
Patient placed in Quick Look pathway, seen and evaluated   Chief Complaint: right leg pain  HPI:  Margaret Bishop is a 67 y.o. female who present to the ED for ritgt leg pain that stated a week ago.   ROS: M/S: right leg pain  Physical Exam:  BP (!) 143/86 (BP Location: Right Arm)   Pulse 87   Temp 98.7 F (37.1 C) (Oral)   Resp 16   SpO2 97%    Gen: No distress  Neuro: Awake and Alert  M/S: tender with palpation right leg from knee to ankle.     Initiation of care has begun. The patient has been counseled on the process, plan, and necessity for staying for the completion/evaluation, and the remainder of the medical screening examination    Ashley Murrain, NP 08/21/17 1755    Janit Bern Koppel, NP 08/24/17 1606    Virgel Manifold, MD 08/25/17 [phone]

## 2017-08-21 NOTE — Progress Notes (Signed)
*  Preliminary Results* Right lower extremity venous duplex completed. Right lower extremity is negative for deep vein thrombosis. There is no evidence of right Baker's cyst.  08/21/2017 6:10 PM  Maudry Mayhew, BS, RVT, RDCS, RDMS

## 2017-08-22 NOTE — Progress Notes (Signed)
  Subjective:  Patient ID: MAIMOUNA RONDEAU, female    DOB: [birth year],  MRN: [id number]  Chief Complaint  Patient presents with  . Foot Pain    right foot has been painful since 1 week   67 y.o. female returns for the above complaint.  Reports right foot is been painful and swollen for the past week.  Denies injury.  Reports is difficult to walk.  Pain goes from the foot up into the ankle  Objective:  There were no vitals filed for this visit. General AA&O x3. Normal mood and affect.  Vascular Pedal pulses palpable.  Pitting edema right lower extremity  Neurologic Epicritic sensation grossly intact.  Dermatologic No open lesions. Skin normal texture and turgor.  Warmth and erythema to the right leg   Orthopedic:  Pain palpation about the right calf   Assessment & Plan:  Patient was evaluated and treated and all questions answered.  Edema RLE; concern for DVT -Attempted to order [place] to r/o DVT. Due to time patient sent to the ER for eval. -Avoid compression until the 1-year-old out.  At that point will consider Unna boot.  No follow-ups on file.

## 2017-09-05 DIAGNOSIS — L03115 Cellulitis of right lower limb: Secondary | ICD-10-CM | POA: Diagnosis not present

## 2017-09-12 DIAGNOSIS — L03116 Cellulitis of left lower limb: Secondary | ICD-10-CM | POA: Diagnosis not present

## 2017-09-24 ENCOUNTER — Other Ambulatory Visit: Payer: Self-pay | Admitting: Podiatry

## 2017-09-24 DIAGNOSIS — L03119 Cellulitis of unspecified part of limb: Secondary | ICD-10-CM

## 2017-09-27 ENCOUNTER — Ambulatory Visit: Payer: Medicare Other | Admitting: Podiatry

## 2017-10-04 DIAGNOSIS — L03116 Cellulitis of left lower limb: Secondary | ICD-10-CM | POA: Diagnosis not present

## 2017-10-22 DIAGNOSIS — Z79899 Other long term (current) drug therapy: Secondary | ICD-10-CM | POA: Diagnosis not present

## 2017-10-22 DIAGNOSIS — M1990 Unspecified osteoarthritis, unspecified site: Secondary | ICD-10-CM | POA: Diagnosis not present

## 2017-10-22 DIAGNOSIS — Z86718 Personal history of other venous thrombosis and embolism: Secondary | ICD-10-CM | POA: Diagnosis not present

## 2017-10-22 DIAGNOSIS — I890 Lymphedema, not elsewhere classified: Secondary | ICD-10-CM | POA: Diagnosis not present

## 2017-10-22 DIAGNOSIS — Z8679 Personal history of other diseases of the circulatory system: Secondary | ICD-10-CM | POA: Diagnosis not present

## 2017-10-22 DIAGNOSIS — Z86711 Personal history of pulmonary embolism: Secondary | ICD-10-CM | POA: Diagnosis not present

## 2017-10-26 DIAGNOSIS — E119 Type 2 diabetes mellitus without complications: Secondary | ICD-10-CM | POA: Diagnosis not present

## 2017-10-26 DIAGNOSIS — I10 Essential (primary) hypertension: Secondary | ICD-10-CM | POA: Diagnosis not present

## 2017-10-30 DIAGNOSIS — I890 Lymphedema, not elsewhere classified: Secondary | ICD-10-CM | POA: Diagnosis not present

## 2017-11-06 DIAGNOSIS — I890 Lymphedema, not elsewhere classified: Secondary | ICD-10-CM | POA: Diagnosis not present

## 2017-11-13 DIAGNOSIS — I890 Lymphedema, not elsewhere classified: Secondary | ICD-10-CM | POA: Diagnosis not present

## 2017-11-20 DIAGNOSIS — I890 Lymphedema, not elsewhere classified: Secondary | ICD-10-CM | POA: Diagnosis not present

## 2017-11-27 DIAGNOSIS — I890 Lymphedema, not elsewhere classified: Secondary | ICD-10-CM | POA: Diagnosis not present

## 2017-12-04 DIAGNOSIS — I890 Lymphedema, not elsewhere classified: Secondary | ICD-10-CM | POA: Diagnosis not present

## 2017-12-11 DIAGNOSIS — I890 Lymphedema, not elsewhere classified: Secondary | ICD-10-CM | POA: Diagnosis not present

## 2017-12-17 DIAGNOSIS — I890 Lymphedema, not elsewhere classified: Secondary | ICD-10-CM | POA: Diagnosis not present

## 2017-12-23 DIAGNOSIS — I890 Lymphedema, not elsewhere classified: Secondary | ICD-10-CM | POA: Diagnosis not present

## 2017-12-24 DIAGNOSIS — R001 Bradycardia, unspecified: Secondary | ICD-10-CM | POA: Diagnosis not present

## 2017-12-24 DIAGNOSIS — I10 Essential (primary) hypertension: Secondary | ICD-10-CM | POA: Diagnosis not present

## 2018-01-01 DIAGNOSIS — I890 Lymphedema, not elsewhere classified: Secondary | ICD-10-CM | POA: Diagnosis not present

## 2018-01-02 DIAGNOSIS — I87313 Chronic venous hypertension (idiopathic) with ulcer of bilateral lower extremity: Secondary | ICD-10-CM | POA: Diagnosis not present

## 2018-01-09 DIAGNOSIS — E782 Mixed hyperlipidemia: Secondary | ICD-10-CM | POA: Diagnosis not present

## 2018-01-09 DIAGNOSIS — E119 Type 2 diabetes mellitus without complications: Secondary | ICD-10-CM | POA: Diagnosis not present

## 2018-01-09 DIAGNOSIS — I10 Essential (primary) hypertension: Secondary | ICD-10-CM | POA: Diagnosis not present

## 2018-01-09 DIAGNOSIS — I739 Peripheral vascular disease, unspecified: Secondary | ICD-10-CM | POA: Diagnosis not present

## 2018-01-10 DIAGNOSIS — I890 Lymphedema, not elsewhere classified: Secondary | ICD-10-CM | POA: Diagnosis not present

## 2018-01-13 DIAGNOSIS — H524 Presbyopia: Secondary | ICD-10-CM | POA: Diagnosis not present

## 2018-01-13 DIAGNOSIS — H52221 Regular astigmatism, right eye: Secondary | ICD-10-CM | POA: Diagnosis not present

## 2018-01-13 DIAGNOSIS — H25813 Combined forms of age-related cataract, bilateral: Secondary | ICD-10-CM | POA: Diagnosis not present

## 2018-01-13 DIAGNOSIS — H5203 Hypermetropia, bilateral: Secondary | ICD-10-CM | POA: Diagnosis not present

## 2018-01-15 DIAGNOSIS — I890 Lymphedema, not elsewhere classified: Secondary | ICD-10-CM | POA: Diagnosis not present

## 2018-01-23 DIAGNOSIS — I890 Lymphedema, not elsewhere classified: Secondary | ICD-10-CM | POA: Diagnosis not present

## 2018-02-11 DIAGNOSIS — I890 Lymphedema, not elsewhere classified: Secondary | ICD-10-CM | POA: Diagnosis not present

## 2018-05-21 DIAGNOSIS — I890 Lymphedema, not elsewhere classified: Secondary | ICD-10-CM | POA: Diagnosis not present

## 2018-09-11 DIAGNOSIS — I10 Essential (primary) hypertension: Secondary | ICD-10-CM | POA: Diagnosis not present

## 2018-09-11 DIAGNOSIS — Z0000 Encounter for general adult medical examination without abnormal findings: Secondary | ICD-10-CM | POA: Diagnosis not present

## 2018-09-11 DIAGNOSIS — I872 Venous insufficiency (chronic) (peripheral): Secondary | ICD-10-CM | POA: Diagnosis not present

## 2018-09-11 DIAGNOSIS — E119 Type 2 diabetes mellitus without complications: Secondary | ICD-10-CM | POA: Diagnosis not present

## 2018-09-11 DIAGNOSIS — E782 Mixed hyperlipidemia: Secondary | ICD-10-CM | POA: Diagnosis not present

## 2018-09-17 ENCOUNTER — Other Ambulatory Visit (HOSPITAL_COMMUNITY): Payer: Self-pay | Admitting: Family Medicine

## 2018-09-17 DIAGNOSIS — Z13820 Encounter for screening for osteoporosis: Secondary | ICD-10-CM

## 2018-09-17 DIAGNOSIS — Z1231 Encounter for screening mammogram for malignant neoplasm of breast: Secondary | ICD-10-CM

## 2018-09-24 ENCOUNTER — Ambulatory Visit (HOSPITAL_COMMUNITY)
Admission: RE | Admit: 2018-09-24 | Discharge: 2018-09-24 | Disposition: A | Discharge status: Home / Self Care | Payer: Medicare Other | Source: Ambulatory Visit | Attending: Family Medicine | Admitting: Family Medicine

## 2018-09-24 ENCOUNTER — Other Ambulatory Visit: Payer: Self-pay

## 2018-09-24 DIAGNOSIS — Z13820 Encounter for screening for osteoporosis: Secondary | ICD-10-CM

## 2018-09-24 DIAGNOSIS — E2839 Other primary ovarian failure: Secondary | ICD-10-CM | POA: Diagnosis not present

## 2018-09-24 DIAGNOSIS — Z1231 Encounter for screening mammogram for malignant neoplasm of breast: Secondary | ICD-10-CM | POA: Diagnosis not present

## 2018-09-24 DIAGNOSIS — Z780 Asymptomatic menopausal state: Secondary | ICD-10-CM | POA: Diagnosis not present

## 2018-12-12 DIAGNOSIS — I10 Essential (primary) hypertension: Secondary | ICD-10-CM | POA: Diagnosis not present

## 2018-12-12 DIAGNOSIS — E1169 Type 2 diabetes mellitus with other specified complication: Secondary | ICD-10-CM | POA: Diagnosis not present

## 2018-12-12 DIAGNOSIS — E782 Mixed hyperlipidemia: Secondary | ICD-10-CM | POA: Diagnosis not present

## 2019-04-16 DIAGNOSIS — E782 Mixed hyperlipidemia: Secondary | ICD-10-CM | POA: Diagnosis not present

## 2019-04-16 DIAGNOSIS — I739 Peripheral vascular disease, unspecified: Secondary | ICD-10-CM | POA: Diagnosis not present

## 2019-04-16 DIAGNOSIS — I10 Essential (primary) hypertension: Secondary | ICD-10-CM | POA: Diagnosis not present

## 2019-06-25 DIAGNOSIS — H25813 Combined forms of age-related cataract, bilateral: Secondary | ICD-10-CM | POA: Diagnosis not present

## 2019-07-28 NOTE — H&P (Signed)
Surgical History & Physical  Patient Name: Margaret Bishop DOB: [birth year]  Surgery: Cataract extraction with intraocular lens implant phacoemulsification; Left Eye  Surgeon: Baruch Goldmann MD Surgery Date:  08/10/2019 Pre-Op Date:  07/28/2019  HPI: A 67 Yr. old female patient 1. 1. The patient complains of difficulty when driving, which began 3 months ago. Both eyes are affected. The condition is worse night and with daily activities. The complaint is associated with blurry vision. Patient states that vision has declined at a distance since her last visit with optometrist, would like to discuss cataract surgery. This is negatively affecting the patient's quality of life. Answers vary in determining dominant eye. HPI was performed by Baruch Goldmann .  Medical History: Cataracts Bi-Polar COVID vaccinated - Moderna High Blood Pressure LDL  Review of Systems Cardiovascular High Blood Pressure Neurological Bipolar Disorder All recorded systems are negative except as noted above.  Social   Never smoked  Medication Amlodipine, Lamotrigine, Lithium Carbonate, Warfarin, Omeprazole, Olmesartan,   Sx/Procedures Hernia Repair, Gastrointestinal Sx,   Drug Allergies  Onion, Shrimp, Strawberries,   History & Physical: Heent:  Cataract, Left eye NECK: supple without bruits LUNGS: lungs clear to auscultation CV: regular rate and rhythm Abdomen: soft and non-tender  Impression & Plan: Assessment: 1.  COMBINED FORMS AGE RELATED CATARACT; Both Eyes (H25.813)  Plan: 1.  Cataract accounts for the patient's decreased vision. This visual impairment is not correctable with a tolerable change in glasses or contact lenses. Cataract surgery with an implantation of a new lens should significantly improve the visual and functional status of the patient. Discussed all risks, benefits, alternatives, and potential complications. Discussed the procedures and recovery. Patient desires to have surgery.  A-scan ordered and performed today for intra-ocular lens calculations. The surgery will be performed in order to improve vision for driving, reading, and for eye examinations. Recommend phacoemulsification with intra-ocular lens. Left Eye worse, first. Dilates well - shugarcaine by protocol.

## 2019-08-05 NOTE — Patient Instructions (Signed)
Margaret Bishop  08/05/2019     @PREFPERIOPPHARMACY @   Your procedure is scheduled on  08/10/2019 .  Report to [facility] at  1030  A.M.  Call this number if you have problems the morning of surgery:  [phone]   Remember:  Do not eat or drink after midnight.                         Take these medicines the morning of surgery with A SIP OF WATER Azor, lamictal, lithium, prilosec.    Do not wear jewelry, make-up or nail polish.  Do not wear lotions, powders, or perfumes. Please wear deodorant and brush your teeth.  Do not shave 48 hours prior to surgery.  Men may shave face and neck.  Do not bring valuables to the hospital.  [facility] is not responsible for any belongings or valuables.  Contacts, dentures or bridgework may not be worn into surgery.  Leave your suitcase in the car.  After surgery it may be brought to your room.  For patients admitted to the hospital, discharge time will be determined by your treatment team.  Patients discharged the day of surgery will not be allowed to drive home.   Name and phone number of your driver:   family Special instructions:  DO NOT smoke the morning of your procedure.  Please read over the following fact sheets that you were given. Anesthesia Post-op Instructions and Care and Recovery After Surgery       Cataract Surgery, Care After This sheet gives you information about how to care for yourself after your procedure. Your health care provider may also give you more specific instructions. If you have problems or questions, contact your health care provider. What can I expect after the procedure? After the procedure, it is common to have:  Itching.  Discomfort.  Fluid discharge.  Sensitivity to light and to touch.  Bruising in or around the eye.  Mild blurred vision. Follow these instructions at home: Eye care   Do not touch or rub your eyes.  Protect your eyes as told by your health care provider. You  may be told to wear a protective eye shield or sunglasses.  Do not put a contact lens into the affected eye or eyes until your health care provider approves.  Keep the area around your eye clean and dry: ? Avoid swimming. ? Do not allow water to hit you directly in the face while showering. ? Keep soap and shampoo out of your eyes.  Check your eye every day for signs of infection. Watch for: ? Redness, swelling, or pain. ? Fluid, blood, or pus. ? Warmth. ? A bad smell. ? Vision that is getting worse. ? Sensitivity that is getting worse. Activity  Do not drive for 24 hours if you were given a sedative during your procedure.  Avoid strenuous activities, such as playing contact sports, for as long as told by your health care provider.  Do not drive or use heavy machinery until your health care provider approves.  Do not bend or lift heavy objects. Bending increases pressure in the eye. You can walk, climb stairs, and do light household chores.  Ask your health care provider when you can return to work. If you work in a dusty environment, you may be advised to wear protective eyewear for a period of time. General instructions  Take or apply over-the-counter and prescription  medicines only as told by your health care provider. This includes eye drops.  Keep all follow-up visits as told by your health care provider. This is important. Contact a health care provider if:  You have increased bruising around your eye.  You have pain that is not helped with medicine.  You have a fever.  You have redness, swelling, or pain in your eye.  You have fluid, blood, or pus coming from your incision.  Your vision gets worse.  Your sensitivity to light gets worse. Get help right away if:  You have sudden loss of vision.  You see flashes of light or spots (floaters).  You have severe eye pain.  You develop nausea or vomiting. Summary  After your procedure, it is common to have  itching, discomfort, bruising, fluid discharge, or sensitivity to light.  Follow instructions from your health care provider about caring for your eye after the procedure.  Do not rub your eye after the procedure. You may need to wear eye protection or sunglasses. Do not wear contact lenses. Keep the area around your eye clean and dry.  Avoid activities that require a lot of effort. These include playing sports and lifting heavy objects.  Contact a health care provider if you have increased bruising, pain that does not go away, or a fever. Get help right away if you suddenly lose your vision, see flashes of light or spots, or have severe pain in the eye. This information is not intended to replace advice given to you by your health care provider. Make sure you discuss any questions you have with your health care provider. Document Revised: 11/11/2018 Document Reviewed: 07/15/2017 Elsevier Patient Education  2020 [place] After These instructions provide you with information about caring for yourself after your procedure. Your health care provider may also give you more specific instructions. Your treatment has been planned according to current medical practices, but problems sometimes occur. Call your health care provider if you have any problems or questions after your procedure. What can I expect after the procedure? After your procedure, you may:  Feel sleepy for several hours.  Feel clumsy and have poor balance for several hours.  Feel forgetful about what happened after the procedure.  Have poor judgment for several hours.  Feel nauseous or vomit.  Have a sore throat if you had a breathing tube during the procedure. Follow these instructions at home: For at least 24 hours after the procedure:      Have a responsible adult stay with you. It is important to have someone help care for you until you are awake and alert.  Rest as needed.  Do  not: ? Participate in activities in which you could fall or become injured. ? Drive. ? Use heavy machinery. ? Drink alcohol. ? Take sleeping pills or medicines that cause drowsiness. ? Make important decisions or sign legal documents. ? Take care of children on your own. Eating and drinking  Follow the diet that is recommended by your health care provider.  If you vomit, drink water, juice, or soup when you can drink without vomiting.  Make sure you have little or no nausea before eating solid foods. General instructions  Take over-the-counter and prescription medicines only as told by your health care provider.  If you have sleep apnea, surgery and certain medicines can increase your risk for breathing problems. Follow instructions from your health care provider about wearing your sleep device: ? Anytime you  are sleeping, including during daytime naps. ? While taking prescription pain medicines, sleeping medicines, or medicines that make you drowsy.  If you smoke, do not smoke without supervision.  Keep all follow-up visits as told by your health care provider. This is important. Contact a health care provider if:  You keep feeling nauseous or you keep vomiting.  You feel light-headed.  You develop a rash.  You have a fever. Get help right away if:  You have trouble breathing. Summary  For several hours after your procedure, you may feel sleepy and have poor judgment.  Have a responsible adult stay with you for at least 24 hours or until you are awake and alert. This information is not intended to replace advice given to you by your health care provider. Make sure you discuss any questions you have with your health care provider. Document Revised: 04/15/2017 Document Reviewed: 05/08/2015 Elsevier Patient Education  [place].

## 2019-08-06 ENCOUNTER — Other Ambulatory Visit: Payer: Self-pay

## 2019-08-06 ENCOUNTER — Other Ambulatory Visit (HOSPITAL_COMMUNITY): Payer: Medicare Other | Attending: Ophthalmology

## 2019-08-06 ENCOUNTER — Encounter (HOSPITAL_COMMUNITY)
Admission: RE | Admit: 2019-08-06 | Discharge: 2019-08-06 | Disposition: A | Discharge status: Home / Self Care | Payer: Medicare Other | Source: Ambulatory Visit | Attending: Ophthalmology | Admitting: Ophthalmology

## 2019-08-06 DIAGNOSIS — H25812 Combined forms of age-related cataract, left eye: Secondary | ICD-10-CM | POA: Diagnosis not present

## 2019-08-10 ENCOUNTER — Encounter (HOSPITAL_COMMUNITY): Payer: Self-pay | Admitting: Ophthalmology

## 2019-08-10 ENCOUNTER — Ambulatory Visit (HOSPITAL_COMMUNITY): Payer: Medicare Other | Admitting: Certified Registered Nurse Anesthetist

## 2019-08-10 ENCOUNTER — Encounter (HOSPITAL_COMMUNITY)
Admission: RE | Disposition: A | Discharge status: Home / Self Care | Payer: Self-pay | Source: Home / Self Care | Attending: Ophthalmology

## 2019-08-10 ENCOUNTER — Ambulatory Visit (HOSPITAL_COMMUNITY)
Admission: RE | Admit: 2019-08-10 | Discharge: 2019-08-10 | Disposition: A | Discharge status: Home / Self Care | Payer: Medicare Other | Attending: Ophthalmology | Admitting: Ophthalmology

## 2019-08-10 DIAGNOSIS — I10 Essential (primary) hypertension: Secondary | ICD-10-CM | POA: Diagnosis not present

## 2019-08-10 DIAGNOSIS — Z91018 Allergy to other foods: Secondary | ICD-10-CM | POA: Diagnosis not present

## 2019-08-10 DIAGNOSIS — H25813 Combined forms of age-related cataract, bilateral: Secondary | ICD-10-CM | POA: Diagnosis not present

## 2019-08-10 DIAGNOSIS — H25812 Combined forms of age-related cataract, left eye: Secondary | ICD-10-CM | POA: Diagnosis not present

## 2019-08-10 DIAGNOSIS — Z7901 Long term (current) use of anticoagulants: Secondary | ICD-10-CM | POA: Insufficient documentation

## 2019-08-10 DIAGNOSIS — F319 Bipolar disorder, unspecified: Secondary | ICD-10-CM | POA: Insufficient documentation

## 2019-08-10 DIAGNOSIS — Z79899 Other long term (current) drug therapy: Secondary | ICD-10-CM | POA: Diagnosis not present

## 2019-08-10 DIAGNOSIS — K219 Gastro-esophageal reflux disease without esophagitis: Secondary | ICD-10-CM | POA: Diagnosis not present

## 2019-08-10 HISTORY — PX: CATARACT EXTRACTION W/PHACO: SHX586

## 2019-08-10 SURGERY — PHACOEMULSIFICATION, CATARACT, WITH IOL INSERTION
Anesthesia: Monitor Anesthesia Care | Site: Eye | Laterality: Left

## 2019-08-10 MED ORDER — EPINEPHRINE PF 1 MG/ML IJ SOLN
INTRAOCULAR | Status: DC | PRN
  Administered 2019-08-10: 500 mL

## 2019-08-10 MED ORDER — SODIUM HYALURONATE 23 MG/ML IO SOLN
INTRAOCULAR | Status: DC | PRN
  Administered 2019-08-10: 0.6 mL via INTRAOCULAR

## 2019-08-10 MED ORDER — BSS IO SOLN
INTRAOCULAR | Status: DC | PRN
  Administered 2019-08-10: 15 mL via INTRAOCULAR

## 2019-08-10 MED ORDER — EPINEPHRINE PF 1 MG/ML IJ SOLN
INTRAMUSCULAR | Status: AC
  Filled 2019-08-10: qty 2

## 2019-08-10 MED ORDER — PHENYLEPHRINE HCL 2.5 % OP SOLN
1.0000 [drp] | OPHTHALMIC | Status: AC | PRN
  Administered 2019-08-10 (×3): 1 [drp] via OPHTHALMIC

## 2019-08-10 MED ORDER — CYCLOPENTOLATE-PHENYLEPHRINE 0.2-1 % OP SOLN
1.0000 [drp] | OPHTHALMIC | Status: AC | PRN
  Administered 2019-08-10 (×3): 1 [drp] via OPHTHALMIC

## 2019-08-10 MED ORDER — TETRACAINE HCL 0.5 % OP SOLN
1.0000 [drp] | OPHTHALMIC | Status: AC | PRN
  Administered 2019-08-10 (×3): 1 [drp] via OPHTHALMIC

## 2019-08-10 MED ORDER — LIDOCAINE HCL (PF) 1 % IJ SOLN
INTRAOCULAR | Status: DC | PRN
  Administered 2019-08-10: 1 mL via OPHTHALMIC

## 2019-08-10 MED ORDER — PROVISC 10 MG/ML IO SOLN
INTRAOCULAR | Status: DC | PRN
  Administered 2019-08-10: 0.85 mL via INTRAOCULAR

## 2019-08-10 MED ORDER — LIDOCAINE HCL 3.5 % OP GEL
1.0000 "application " | Freq: Once | OPHTHALMIC | Status: AC
  Administered 2019-08-10: 1 via OPHTHALMIC

## 2019-08-10 MED ORDER — NEOMYCIN-POLYMYXIN-DEXAMETH 3.5-10000-0.1 OP SUSP
OPHTHALMIC | Status: DC | PRN
  Administered 2019-08-10: 1 [drp] via OPHTHALMIC

## 2019-08-10 MED ORDER — POVIDONE-IODINE 5 % OP SOLN
OPHTHALMIC | Status: DC | PRN
  Administered 2019-08-10: 1 via OPHTHALMIC

## 2019-08-10 SURGICAL SUPPLY — 13 items
CLOTH BEACON ORANGE TIMEOUT ST (SAFETY) ×1 IMPLANT
EYE SHIELD UNIVERSAL CLEAR (GAUZE/BANDAGES/DRESSINGS) ×1 IMPLANT
GLOVE BIOGEL PI IND STRL 7.0 (GLOVE) IMPLANT
GLOVE BIOGEL PI INDICATOR 7.0 (GLOVE) ×2
LENS ALC ACRYL/TECN (Ophthalmic Related) ×1 IMPLANT
NDL HYPO 18GX1.5 BLUNT FILL (NEEDLE) IMPLANT
NEEDLE HYPO 18GX1.5 BLUNT FILL (NEEDLE) ×2 IMPLANT
PAD ARMBOARD 7.5X6 YLW CONV (MISCELLANEOUS) ×1 IMPLANT
SYR TB 1ML LL NO SAFETY (SYRINGE) ×1 IMPLANT
TAPE SURG TRANSPORE 1 IN (GAUZE/BANDAGES/DRESSINGS) IMPLANT
TAPE SURGICAL TRANSPORE 1 IN (GAUZE/BANDAGES/DRESSINGS) ×2
VISCOELASTIC ADDITIONAL (OPHTHALMIC RELATED) ×1 IMPLANT
WATER STERILE IRR 250ML POUR (IV SOLUTION) ×1 IMPLANT

## 2019-08-10 NOTE — Transfer of Care (Signed)
Immediate Anesthesia Transfer of Care Note  Patient: Margaret Bishop  Procedure(s) Performed: CATARACT EXTRACTION PHACO AND INTRAOCULAR LENS PLACEMENT LEFT EYE (Left Eye)  Patient Location: PACU  Anesthesia Type:MAC  Level of Consciousness: awake, alert , oriented and patient cooperative  Airway & Oxygen Therapy: Patient Spontanous Breathing  Post-op Assessment: Report given to RN and Post -op Vital signs reviewed and stable  Post vital signs: Reviewed and stable  Last Vitals: see phase 2 PACU VS; VSS Vitals Value Taken Time  BP    Temp    Pulse    Resp    SpO2      Last Pain:  Vitals:   08/10/19 1121  PainSc: 0-No pain         Complications: No complications documented.

## 2019-08-10 NOTE — Anesthesia Preprocedure Evaluation (Signed)
Anesthesia Evaluation  Patient identified by MRN, date of birth, ID band Patient awake    Reviewed: Allergy & Precautions, H&P , NPO status , Patient's Chart, lab work & pertinent test results, reviewed documented beta blocker date and time   Airway Mallampati: II  TM Distance: >3 FB Neck ROM: full    Dental no notable dental hx.    Pulmonary shortness of breath,    Pulmonary exam normal breath sounds clear to auscultation       Cardiovascular Exercise Tolerance: Good hypertension, negative cardio ROS   Rhythm:regular Rate:Normal     Neuro/Psych PSYCHIATRIC DISORDERS Bipolar Disorder negative neurological ROS     GI/Hepatic Neg liver ROS, GERD  Medicated,  Endo/Other  negative endocrine ROS  Renal/GU negative Renal ROS  negative genitourinary   Musculoskeletal   Abdominal   Peds  Hematology negative hematology ROS (+)   Anesthesia Other Findings   Reproductive/Obstetrics negative OB ROS                             Anesthesia Physical Anesthesia Plan  ASA: III  Anesthesia Plan: MAC   Post-op Pain Management:    Induction:   PONV Risk Score and Plan: 2  Airway Management Planned:   Additional Equipment:   Intra-op Plan:   Post-operative Plan:   Informed Consent: I have reviewed the patients History and Physical, chart, labs and discussed the procedure including the risks, benefits and alternatives for the proposed anesthesia with the patient or authorized representative who has indicated his/her understanding and acceptance.     Dental Advisory Given  Plan Discussed with: CRNA  Anesthesia Plan Comments:         Anesthesia Quick Evaluation

## 2019-08-10 NOTE — Op Note (Signed)
Date of procedure: 08/10/19  Pre-operative diagnosis: Visually significant age-related combined cataract, Left Eye (H25.812)  Post-operative diagnosis: Visually significant age-related combined cataract, Left Eye (H25.812)  Procedure: Removal of cataract via phacoemulsification and insertion of intra-ocular lens Wynetta Emery and [place]  +22.5D into the capsular bag of the Left Eye  Attending surgeon: Gerda Diss. Signora Zucco, MD, MA  Anesthesia: MAC, Topical Akten  Complications: None  Estimated Blood Loss: <27m (minimal)  Specimens: None  Implants: As above  Indications:  Visually significant age-related cataract, Left Eye  Procedure:  The patient was seen and identified in the pre-operative area. The operative eye was identified and dilated.  The operative eye was marked.  Topical anesthesia was administered to the operative eye.     The patient was then to the operative suite and placed in the supine position.  A timeout was performed confirming the patient, procedure to be performed, and all other relevant information.   The patient's face was prepped and draped in the usual fashion for intra-ocular surgery.  A lid speculum was placed into the operative eye and the surgical microscope moved into place and focused.  An inferotemporal paracentesis was created using a 20 gauge paracentesis blade.  Shugarcaine was injected into the anterior chamber.  Viscoelastic was injected into the anterior chamber.  A temporal clear-corneal main wound incision was created using a 2.471mmicrokeratome.  A continuous curvilinear capsulorrhexis was initiated using an irrigating cystitome and completed using capsulorrhexis forceps.  Hydrodissection and hydrodeliniation were performed.  Viscoelastic was injected into the anterior chamber.  A phacoemulsification handpiece and a chopper as a second instrument were used to remove the nucleus and epinucleus. The irrigation/aspiration handpiece was used to remove  any remaining cortical material.   The capsular bag was reinflated with viscoelastic, checked, and found to be intact.  The intraocular lens was inserted into the capsular bag.  The irrigation/aspiration handpiece was used to remove any remaining viscoelastic.  The clear corneal wound and paracentesis wounds were then hydrated and checked with Weck-Cels to be watertight.  The lid-speculum was removed.  The drape was removed.  The patient's face was cleaned with a wet and dry 4x4.   Maxitrol was instilled in the eye. A clear shield was taped over the eye. The patient was taken to the post-operative care unit in good condition, having tolerated the procedure well.  Post-Op Instructions: [facility] and will receive all other orders and instructions.

## 2019-08-10 NOTE — Discharge Instructions (Signed)
Please discharge patient when stable, will follow up today with Dr. Keona Bilyeu at the [facility] [place] office immediately following discharge.  Leave shield in place until visit.  All paperwork with discharge instructions will be given at the office.  [facility] [place] Address:  [address]  [place], [place] [address]  

## 2019-08-10 NOTE — Anesthesia Postprocedure Evaluation (Signed)
Anesthesia Post Note  Patient: Margaret Bishop  Procedure(s) Performed: CATARACT EXTRACTION PHACO AND INTRAOCULAR LENS PLACEMENT LEFT EYE (Left Eye)  Patient location during evaluation: Phase II Anesthesia Type: MAC Level of consciousness: awake, awake and alert, oriented and patient cooperative Pain management: pain level controlled Vital Signs Assessment: post-procedure vital signs reviewed and stable Respiratory status: spontaneous breathing and respiratory function stable Cardiovascular status: blood pressure returned to baseline and stable Anesthetic complications: no   No complications documented.   Last Vitals:  Vitals:   08/10/19 1130 08/10/19 1145  BP:    Pulse:    Resp: (!) 21 17  Temp:    SpO2: 100% 100%    Last Pain:  Vitals:   08/10/19 1121  PainSc: 0-No pain                 CARVER,ALISON L

## 2019-08-10 NOTE — Addendum Note (Signed)
Addendum  created 08/10/19 1446 by Raenette Rover, CRNA   Charge Capture section accepted

## 2019-08-10 NOTE — Interval H&P Note (Signed)
History and Physical Interval Note:  08/10/2019 11:49 AM  Margaret Bishop  has presented today for surgery, with the diagnosis of Nuclear sclerotic cataract - Left eye.  The various methods of treatment have been discussed with the patient and family. After consideration of risks, benefits and other options for treatment, the patient has consented to  Procedure(s) with comments: CATARACT EXTRACTION PHACO AND INTRAOCULAR LENS PLACEMENT (IOC) (Left) - CDE:  as a surgical intervention.  The patient's history has been reviewed, patient examined, no change in status, stable for surgery.  I have reviewed the patient's chart and labs.  Questions were answered to the patient's satisfaction.     Baruch Goldmann

## 2019-08-11 ENCOUNTER — Encounter (HOSPITAL_COMMUNITY): Payer: Self-pay | Admitting: Ophthalmology

## 2019-08-17 ENCOUNTER — Encounter (HOSPITAL_COMMUNITY): Payer: Self-pay

## 2019-08-17 ENCOUNTER — Other Ambulatory Visit: Payer: Self-pay

## 2019-08-17 ENCOUNTER — Encounter (HOSPITAL_COMMUNITY)
Admission: RE | Admit: 2019-08-17 | Discharge: 2019-08-17 | Disposition: A | Discharge status: Home / Self Care | Payer: Medicare Other | Source: Ambulatory Visit | Attending: Ophthalmology | Admitting: Ophthalmology

## 2019-08-17 DIAGNOSIS — Z9842 Cataract extraction status, left eye: Secondary | ICD-10-CM | POA: Diagnosis not present

## 2019-08-17 DIAGNOSIS — H25811 Combined forms of age-related cataract, right eye: Secondary | ICD-10-CM | POA: Diagnosis not present

## 2019-08-17 DIAGNOSIS — H25813 Combined forms of age-related cataract, bilateral: Secondary | ICD-10-CM | POA: Diagnosis not present

## 2019-08-18 NOTE — H&P (Signed)
Surgical History & Physical  Patient Name: Margaret Bishop DOB: 06-Jan-1951  Surgery: Cataract extraction with intraocular lens implant phacoemulsification; Right Eye  Surgeon: Baruch Goldmann MD Surgery Date:  08/24/2019 Pre-Op Date:  08/17/2019  HPI: A 30 Yr. old female patient 1. The patient is returning after cataract post-op. The left eye is affected. Since the last visit, the affected area feels improvement and is doing well. The condition's severity is none. Patient is following medication instructions. PT states since having the left eye surgery she feels her vision in her right eye has decreased. She states this is negatively affecting her quality of life. Pt reports no issues with dryness, irritation or discomfort. No eye pain. No flashes or floaters. HPI was performed by Baruch Goldmann .  Medical History: Cataracts Bi-Polar COVID vaccinated - Moderna High Blood Pressure LDL  Review of Systems Cardiovascular High Blood Pressure Neurological Bipolar Disorder All recorded systems are negative except as noted above.  Social   Never smoked  Medication Prednisolone-gatiflox-bromfenac,  Amlodipine, Lamotrigine, Lithium Carbonate, Warfarin, Omeprazole, Olmesartan,   Sx/Procedures Phaco c IOL,  Hernia Repair, Gastrointestinal Sx,   Drug Allergies  Onion, Shrimp, Strawberries,   History & Physical: Heent:  Cataract, Right eye NECK: supple without bruits LUNGS: lungs clear to auscultation CV: regular rate and rhythm Abdomen: soft and non-tender  Impression & Plan: Assessment: 1.  CATARACT EXTRACTION STATUS; Left Eye (Z98.42) 2.  COMBINED FORMS AGE RELATED CATARACT; Both Eyes (H25.813)  Plan: 1.  1 week after cataract surgery. Doing well with improved vision and normal eye pressure. Call with any problems or concerns. Continue Gati-Brom-Pred 2x/day for 3 more weeks. 2.  Dilates well - shugarcaine by protocol. Cataract accounts for the patient's decreased vision. This  visual impairment is not correctable with a tolerable change in glasses or contact lenses. Cataract surgery with an implantation of a new lens should significantly improve the visual and functional status of the patient. Discussed all risks, benefits, alternatives, and potential complications. Discussed the procedures and recovery. Patient desires to have surgery. A-scan ordered and performed today for intra-ocular lens calculations. The surgery will be performed in order to improve vision for driving, reading, and for eye examinations. Recommend phacoemulsification with intra-ocular lens. Right Eye. Surgery required to correct imbalance of vision.

## 2019-08-20 DIAGNOSIS — Z0000 Encounter for general adult medical examination without abnormal findings: Secondary | ICD-10-CM | POA: Diagnosis not present

## 2019-08-20 DIAGNOSIS — E782 Mixed hyperlipidemia: Secondary | ICD-10-CM | POA: Diagnosis not present

## 2019-08-20 DIAGNOSIS — I10 Essential (primary) hypertension: Secondary | ICD-10-CM | POA: Diagnosis not present

## 2019-08-20 DIAGNOSIS — E1169 Type 2 diabetes mellitus with other specified complication: Secondary | ICD-10-CM | POA: Diagnosis not present

## 2019-08-24 ENCOUNTER — Encounter (HOSPITAL_COMMUNITY)
Admission: RE | Disposition: A | Discharge status: Home / Self Care | Payer: Medicare Other | Source: Home / Self Care | Attending: Ophthalmology

## 2019-08-24 ENCOUNTER — Encounter (HOSPITAL_COMMUNITY): Payer: Self-pay | Admitting: Ophthalmology

## 2019-08-24 ENCOUNTER — Ambulatory Visit (HOSPITAL_COMMUNITY): Payer: Medicare Other | Admitting: Anesthesiology

## 2019-08-24 ENCOUNTER — Other Ambulatory Visit: Payer: Self-pay

## 2019-08-24 ENCOUNTER — Ambulatory Visit (HOSPITAL_COMMUNITY)
Admission: RE | Admit: 2019-08-24 | Discharge: 2019-08-24 | Disposition: A | Discharge status: Home / Self Care | Payer: Medicare Other | Attending: Ophthalmology | Admitting: Ophthalmology

## 2019-08-24 DIAGNOSIS — M1990 Unspecified osteoarthritis, unspecified site: Secondary | ICD-10-CM | POA: Insufficient documentation

## 2019-08-24 DIAGNOSIS — Z79899 Other long term (current) drug therapy: Secondary | ICD-10-CM | POA: Diagnosis not present

## 2019-08-24 DIAGNOSIS — H2511 Age-related nuclear cataract, right eye: Secondary | ICD-10-CM | POA: Diagnosis not present

## 2019-08-24 DIAGNOSIS — F319 Bipolar disorder, unspecified: Secondary | ICD-10-CM | POA: Diagnosis not present

## 2019-08-24 DIAGNOSIS — K219 Gastro-esophageal reflux disease without esophagitis: Secondary | ICD-10-CM | POA: Diagnosis not present

## 2019-08-24 DIAGNOSIS — H25811 Combined forms of age-related cataract, right eye: Secondary | ICD-10-CM | POA: Diagnosis not present

## 2019-08-24 DIAGNOSIS — Z7901 Long term (current) use of anticoagulants: Secondary | ICD-10-CM | POA: Insufficient documentation

## 2019-08-24 DIAGNOSIS — I10 Essential (primary) hypertension: Secondary | ICD-10-CM | POA: Insufficient documentation

## 2019-08-24 HISTORY — PX: CATARACT EXTRACTION W/PHACO: SHX586

## 2019-08-24 SURGERY — PHACOEMULSIFICATION, CATARACT, WITH IOL INSERTION
Anesthesia: Monitor Anesthesia Care | Site: Eye | Laterality: Right

## 2019-08-24 MED ORDER — LIDOCAINE HCL (PF) 1 % IJ SOLN
INTRAOCULAR | Status: DC | PRN
  Administered 2019-08-24: 1 mL via OPHTHALMIC

## 2019-08-24 MED ORDER — PROVISC 10 MG/ML IO SOLN
INTRAOCULAR | Status: DC | PRN
  Administered 2019-08-24: 0.85 mL via INTRAOCULAR

## 2019-08-24 MED ORDER — EPINEPHRINE PF 1 MG/ML IJ SOLN
INTRAOCULAR | Status: DC | PRN
  Administered 2019-08-24: 500 mL

## 2019-08-24 MED ORDER — CYCLOPENTOLATE-PHENYLEPHRINE 0.2-1 % OP SOLN
1.0000 [drp] | OPHTHALMIC | Status: AC | PRN
  Administered 2019-08-24 (×3): 1 [drp] via OPHTHALMIC

## 2019-08-24 MED ORDER — LACTATED RINGERS IV SOLN: INTRAVENOUS | Status: DC

## 2019-08-24 MED ORDER — NEOMYCIN-POLYMYXIN-DEXAMETH 3.5-10000-0.1 OP SUSP
OPHTHALMIC | Status: DC | PRN
  Administered 2019-08-24: 1 [drp] via OPHTHALMIC

## 2019-08-24 MED ORDER — LIDOCAINE HCL 3.5 % OP GEL
1.0000 "application " | Freq: Once | OPHTHALMIC | Status: AC
  Administered 2019-08-24: 1 via OPHTHALMIC

## 2019-08-24 MED ORDER — BSS IO SOLN
INTRAOCULAR | Status: DC | PRN
  Administered 2019-08-24: 15 mL via INTRAOCULAR

## 2019-08-24 MED ORDER — PHENYLEPHRINE HCL 2.5 % OP SOLN
1.0000 [drp] | OPHTHALMIC | Status: AC | PRN
  Administered 2019-08-24 (×3): 1 [drp] via OPHTHALMIC

## 2019-08-24 MED ORDER — SODIUM HYALURONATE 23 MG/ML IO SOLN
INTRAOCULAR | Status: DC | PRN
  Administered 2019-08-24: 0.6 mL via INTRAOCULAR

## 2019-08-24 MED ORDER — POVIDONE-IODINE 5 % OP SOLN
OPHTHALMIC | Status: DC | PRN
  Administered 2019-08-24: 1 via OPHTHALMIC

## 2019-08-24 MED ORDER — TETRACAINE HCL 0.5 % OP SOLN
1.0000 [drp] | OPHTHALMIC | Status: AC | PRN
  Administered 2019-08-24 (×3): 1 [drp] via OPHTHALMIC

## 2019-08-24 SURGICAL SUPPLY — 13 items
CLOTH BEACON ORANGE TIMEOUT ST (SAFETY) ×1 IMPLANT
EYE SHIELD UNIVERSAL CLEAR (GAUZE/BANDAGES/DRESSINGS) ×1 IMPLANT
GLOVE BIOGEL PI IND STRL 7.0 (GLOVE) IMPLANT
GLOVE BIOGEL PI INDICATOR 7.0 (GLOVE) ×2
LENS ALC ACRYL/TECN (Ophthalmic Related) ×1 IMPLANT
NDL HYPO 18GX1.5 BLUNT FILL (NEEDLE) IMPLANT
NEEDLE HYPO 18GX1.5 BLUNT FILL (NEEDLE) ×2 IMPLANT
PAD ARMBOARD 7.5X6 YLW CONV (MISCELLANEOUS) ×1 IMPLANT
SYR TB 1ML LL NO SAFETY (SYRINGE) ×1 IMPLANT
TAPE SURG TRANSPORE 1 IN (GAUZE/BANDAGES/DRESSINGS) IMPLANT
TAPE SURGICAL TRANSPORE 1 IN (GAUZE/BANDAGES/DRESSINGS) ×2
VISCOELASTIC ADDITIONAL (OPHTHALMIC RELATED) ×1 IMPLANT
WATER STERILE IRR 250ML POUR (IV SOLUTION) ×1 IMPLANT

## 2019-08-24 NOTE — Discharge Instructions (Signed)
Please discharge patient when stable, will follow up today with Dr. Giann Obara at the [facility] [place] office immediately following discharge.  Leave shield in place until visit.  All paperwork with discharge instructions will be given at the office.  [facility] [place] Address:  [address]  [place], [place] [address]  

## 2019-08-24 NOTE — Transfer of Care (Signed)
Immediate Anesthesia Transfer of Care Note  Patient: Margaret Bishop  Procedure(s) Performed: CATARACT EXTRACTION PHACO AND INTRAOCULAR LENS PLACEMENT (IOC) (Right Eye)  Patient Location: phase 2  Anesthesia Type:MAC  Level of Consciousness: awake, alert , oriented and patient cooperative  Airway & Oxygen Therapy: Patient Spontanous Breathing and Patient connected to nasal cannula oxygen  Post-op Assessment: Report given to RN, Post -op Vital signs reviewed and stable and Patient moving all extremities  Post vital signs: Reviewed and stable  Last Vitals:  Vitals Value Taken Time  BP    Temp    Pulse    Resp    SpO2      Last Pain:  Vitals:   08/24/19 0955  TempSrc: Oral  PainSc: 0-No pain         Complications: No complications documented.

## 2019-08-24 NOTE — Anesthesia Preprocedure Evaluation (Signed)
Anesthesia Evaluation  Patient identified by MRN, date of birth, ID band Patient awake    Reviewed: Allergy & Precautions, H&P , NPO status , Patient's Chart, lab work & pertinent test results, reviewed documented beta blocker date and time   History of Anesthesia Complications Negative for: history of anesthetic complications  Airway Mallampati: II  TM Distance: >3 FB Neck ROM: full    Dental  (+) Partial Lower, Partial Upper   Pulmonary shortness of breath and with exertion,    Pulmonary exam normal breath sounds clear to auscultation       Cardiovascular Exercise Tolerance: Good hypertension, Pt. on medications negative cardio ROS   Rhythm:regular Rate:Normal     Neuro/Psych PSYCHIATRIC DISORDERS Bipolar Disorder negative neurological ROS     GI/Hepatic Neg liver ROS, GERD  Medicated,  Endo/Other  negative endocrine ROS  Renal/GU negative Renal ROS  negative genitourinary   Musculoskeletal  (+) Arthritis ,   Abdominal   Peds  Hematology negative hematology ROS (+)   Anesthesia Other Findings   Reproductive/Obstetrics negative OB ROS                             Anesthesia Physical  Anesthesia Plan  ASA: III  Anesthesia Plan: MAC   Post-op Pain Management:    Induction:   PONV Risk Score and Plan:   Airway Management Planned: Nasal Cannula and Natural Airway  Additional Equipment:   Intra-op Plan:   Post-operative Plan:   Informed Consent: I have reviewed the patients History and Physical, chart, labs and discussed the procedure including the risks, benefits and alternatives for the proposed anesthesia with the patient or authorized representative who has indicated his/her understanding and acceptance.     Dental Advisory Given  Plan Discussed with: CRNA  Anesthesia Plan Comments:         Anesthesia Quick Evaluation

## 2019-08-24 NOTE — Op Note (Signed)
Date of procedure: 08/24/19  Pre-operative diagnosis:  Visually significant combined form age-related cataract, Right Eye (H25.811)  Post-operative diagnosis:  Visually significant combined form age-related cataract, Right Eye (H25.811)  Procedure: Removal of cataract via phacoemulsification and insertion of intra-ocular lens Wynetta Emery and [employer] [id number]  +24.0D into the capsular bag of the Right Eye  Attending surgeon: Gerda Diss. Kazmir Oki, MD, MA  Anesthesia: MAC, Topical Akten  Complications: None  Estimated Blood Loss: <58m (minimal)  Specimens: None  Implants: As above  Indications:  Visually significant age-related cataract, Right Eye  Procedure:  The patient was seen and identified in the pre-operative area. The operative eye was identified and dilated.  The operative eye was marked.  Topical anesthesia was administered to the operative eye.     The patient was then to the operative suite and placed in the supine position.  A timeout was performed confirming the patient, procedure to be performed, and all other relevant information.   The patient's face was prepped and draped in the usual fashion for intra-ocular surgery.  A lid speculum was placed into the operative eye and the surgical microscope moved into place and focused.  A superotemporal paracentesis was created using a 20 gauge paracentesis blade.  Shugarcaine was injected into the anterior chamber.  Viscoelastic was injected into the anterior chamber.  A temporal clear-corneal main wound incision was created using a 2.429mmicrokeratome.  A continuous curvilinear capsulorrhexis was initiated using an irrigating cystitome and completed using capsulorrhexis forceps.  Hydrodissection and hydrodeliniation were performed.  Viscoelastic was injected into the anterior chamber.  A phacoemulsification handpiece and a chopper as a second instrument were used to remove the nucleus and epinucleus. The irrigation/aspiration handpiece was  used to remove any remaining cortical material.   The capsular bag was reinflated with viscoelastic, checked, and found to be intact.  The intraocular lens was inserted into the capsular bag.  The irrigation/aspiration handpiece was used to remove any remaining viscoelastic.  The clear corneal wound and paracentesis wounds were then hydrated and checked with Weck-Cels to be watertight.  The lid-speculum was removed.  The drape was removed.  The patient's face was cleaned with a wet and dry 4x4.   Maxitrol was instilled in the eye. A clear shield was taped over the eye. The patient was taken to the post-operative care unit in good condition, having tolerated the procedure well.  Post-Op Instructions: [facility] and will receive all other orders and instructions.

## 2019-08-24 NOTE — Interval H&P Note (Signed)
History and Physical Interval Note:  08/24/2019 10:19 AM  Margaret Bishop  has presented today for surgery, with the diagnosis of Nuclear sclerotic cataract - Right eye.  The various methods of treatment have been discussed with the patient and family. After consideration of risks, benefits and other options for treatment, the patient has consented to  Procedure(s) with comments: CATARACT EXTRACTION PHACO AND INTRAOCULAR LENS PLACEMENT (IOC) (Right) - right as a surgical intervention.  The patient's history has been reviewed, patient examined, no change in status, stable for surgery.  I have reviewed the patient's chart and labs.  Questions were answered to the patient's satisfaction.     Baruch Goldmann

## 2019-08-25 ENCOUNTER — Encounter (HOSPITAL_COMMUNITY): Payer: Self-pay | Admitting: Ophthalmology

## 2019-08-28 NOTE — Anesthesia Postprocedure Evaluation (Signed)
Anesthesia Post Note  Patient: Margaret Bishop  Procedure(s) Performed: CATARACT EXTRACTION PHACO AND INTRAOCULAR LENS PLACEMENT (IOC) (Right Eye)  Patient location during evaluation: PACU Anesthesia Type: MAC Level of consciousness: awake and alert and oriented Pain management: pain level controlled Vital Signs Assessment: post-procedure vital signs reviewed and stable Respiratory status: spontaneous breathing and respiratory function stable Cardiovascular status: blood pressure returned to baseline and stable Postop Assessment: no apparent nausea or vomiting Anesthetic complications: no   No complications documented.   Last Vitals:  Vitals:   08/24/19 0955 08/24/19 1046  BP: (!) 131/64 (!) 152/88  Pulse: 52 64  Resp: 18 18  Temp: 36.5 C 36.7 C  SpO2: 99% 100%    Last Pain:  Vitals:   08/25/19 0858  TempSrc:   PainSc: 0-No pain                 Markail Diekman C Charmeka [place]

## 2019-12-21 DIAGNOSIS — E1169 Type 2 diabetes mellitus with other specified complication: Secondary | ICD-10-CM | POA: Diagnosis not present

## 2019-12-21 DIAGNOSIS — I10 Essential (primary) hypertension: Secondary | ICD-10-CM | POA: Diagnosis not present

## 2019-12-21 DIAGNOSIS — E782 Mixed hyperlipidemia: Secondary | ICD-10-CM | POA: Diagnosis not present

## 2020-06-20 ENCOUNTER — Emergency Department (HOSPITAL_COMMUNITY)
Admission: EM | Admit: 2020-06-20 | Discharge: 2020-06-21 | Disposition: A | Discharge status: Home / Self Care | Payer: Medicare Other | Attending: Emergency Medicine | Admitting: Emergency Medicine

## 2020-06-20 ENCOUNTER — Other Ambulatory Visit: Payer: Self-pay

## 2020-06-20 ENCOUNTER — Encounter (HOSPITAL_COMMUNITY): Payer: Self-pay | Admitting: Emergency Medicine

## 2020-06-20 DIAGNOSIS — Z96652 Presence of left artificial knee joint: Secondary | ICD-10-CM | POA: Diagnosis not present

## 2020-06-20 DIAGNOSIS — Z79899 Other long term (current) drug therapy: Secondary | ICD-10-CM | POA: Diagnosis not present

## 2020-06-20 DIAGNOSIS — N3000 Acute cystitis without hematuria: Secondary | ICD-10-CM | POA: Insufficient documentation

## 2020-06-20 DIAGNOSIS — I10 Essential (primary) hypertension: Secondary | ICD-10-CM | POA: Diagnosis not present

## 2020-06-20 DIAGNOSIS — R404 Transient alteration of awareness: Secondary | ICD-10-CM | POA: Insufficient documentation

## 2020-06-20 DIAGNOSIS — Z7901 Long term (current) use of anticoagulants: Secondary | ICD-10-CM | POA: Insufficient documentation

## 2020-06-20 DIAGNOSIS — R4182 Altered mental status, unspecified: Secondary | ICD-10-CM | POA: Diagnosis present

## 2020-06-20 LAB — COMPREHENSIVE METABOLIC PANEL
ALT: 17 U/L (ref 0–44)
AST: 22 U/L (ref 15–41)
Albumin: 3.8 g/dL (ref 3.5–5.0)
Alkaline Phosphatase: 43 U/L (ref 38–126)
Anion gap: 6 (ref 5–15)
BUN: 12 mg/dL (ref 8–23)
CO2: 25 mmol/L (ref 22–32)
Calcium: 8.8 mg/dL — ABNORMAL LOW (ref 8.9–10.3)
Chloride: 107 mmol/L (ref 98–111)
Creatinine, Ser: 0.75 mg/dL (ref 0.44–1.00)
GFR, Estimated: >60 mL/min (ref >60)
Glucose, Bld: 91 mg/dL (ref 70–99)
Potassium: 3.7 mmol/L (ref 3.5–5.1)
Sodium: 138 mmol/L (ref 135–145)
Total Bilirubin: 0.6 mg/dL (ref 0.3–1.2)
Total Protein: 6.9 g/dL (ref 6.5–8.1)

## 2020-06-20 LAB — CBC WITH DIFFERENTIAL/PLATELET
Abs Immature Granulocytes: 0.02 10*3/uL (ref 0.00–0.07)
Basophils Absolute: 0.1 10*3/uL (ref 0.0–0.1)
Basophils Relative: 1 %
Eosinophils Absolute: 0.2 10*3/uL (ref 0.0–0.5)
Eosinophils Relative: 4 %
HCT: 37.6 % (ref 36.0–46.0)
Hemoglobin: 11.2 g/dL — ABNORMAL LOW (ref 12.0–15.0)
Immature Granulocytes: 0 %
Lymphocytes Relative: 33 %
Lymphs Abs: 1.8 10*3/uL (ref 0.7–4.0)
MCH: 29.4 pg (ref 26.0–34.0)
MCHC: 29.8 g/dL — ABNORMAL LOW (ref 30.0–36.0)
MCV: 98.7 fL (ref 80.0–100.0)
Monocytes Absolute: 0.6 10*3/uL (ref 0.1–1.0)
Monocytes Relative: 10 %
Neutro Abs: 2.9 10*3/uL (ref 1.7–7.7)
Neutrophils Relative %: 52 %
Platelets: 233 10*3/uL (ref 150–400)
RBC: 3.81 MIL/uL — ABNORMAL LOW (ref 3.87–5.11)
RDW: 12.7 % (ref 11.5–15.5)
WBC: 5.6 10*3/uL (ref 4.0–10.5)
nRBC: 0 % (ref 0.0–0.2)

## 2020-06-20 NOTE — ED Provider Notes (Signed)
Emergency Medicine Provider Triage Evaluation Note  LAKENYA RIENDEAU , a 70 y.o. adult  was evaluated in triage.  Pt complains of possible misuse of medicine.  Patient reports no complaints.  Son brought patient in because she had slurred speech and repetitive questions early this morning.  Concern for inappropriately taking Xanax.  Patient denies this.  Review of Systems  Positive: Slurred speech, repetitive questioning.  Negative: HA  Physical Exam  BP 126/71 (BP Location: Left Arm)   Pulse (!) 50   Temp 98.6 F (37 C) (Oral)   Resp 17   Ht 5' 7.5" (1.715 m)   Wt 90.7 kg   SpO2 99%   BMI 30.86 kg/m  Gen:   Awake, no distress   Resp:  Normal effort  MSK:   Moves extremities without difficulty  Other:  Speech is normal.  Alert and oriented.  Medical Decision Making  Medically screening exam initiated at 10:27 PM.  Appropriate orders placed.  ADDA STOKES was informed that the remainder of the evaluation will be completed by another provider, this initial triage assessment does not replace that evaluation, and the importance of remaining in the ED until their evaluation is complete.  Labs and urine to rule out metabolic abnormality or infection.   Franchot Heidelberg, PA-C 06/20/20 2228    Truddie Hidden, MD 06/20/20 [phone]

## 2020-06-20 NOTE — ED Triage Notes (Signed)
Son believes pt is taking her xanax more than prescribed since her mother passed on  06-27-22.

## 2020-06-21 ENCOUNTER — Emergency Department (HOSPITAL_COMMUNITY): Payer: Medicare Other

## 2020-06-21 LAB — URINALYSIS, ROUTINE W REFLEX MICROSCOPIC
Bilirubin Urine: NEGATIVE
Glucose, UA: NEGATIVE mg/dL
Hgb urine dipstick: NEGATIVE
Ketones, ur: 20 mg/dL — AB
Nitrite: NEGATIVE
Protein, ur: NEGATIVE mg/dL
Specific Gravity, Urine: 1.015 (ref 1.005–1.030)
pH: 5 (ref 5.0–8.0)

## 2020-06-21 LAB — PROTIME-INR
INR: 1.2 (ref 0.8–1.2)
Prothrombin Time: 15.3 seconds — ABNORMAL HIGH (ref 11.4–15.2)

## 2020-06-21 LAB — RAPID URINE DRUG SCREEN, HOSP PERFORMED
Amphetamines: NOT DETECTED
Barbiturates: NOT DETECTED
Benzodiazepines: NOT DETECTED
Cocaine: NOT DETECTED
Opiates: NOT DETECTED
Tetrahydrocannabinol: NOT DETECTED

## 2020-06-21 LAB — ACETAMINOPHEN LEVEL: Acetaminophen (Tylenol), Serum: <10 ug/mL — ABNORMAL LOW (ref 10–30)

## 2020-06-21 LAB — SALICYLATE LEVEL: Salicylate Lvl: <7 mg/dL — ABNORMAL LOW (ref 7.0–30.0)

## 2020-06-21 LAB — LITHIUM LEVEL: Lithium Lvl: 0.42 mmol/L — ABNORMAL LOW (ref 0.60–1.20)

## 2020-06-21 LAB — AMMONIA: Ammonia: 12 umol/L (ref 9–35)

## 2020-06-21 MED ORDER — CEPHALEXIN 500 MG PO CAPS: 500.0000 mg | ORAL_CAPSULE | Freq: Two times a day (BID) | ORAL | 0 refills | Status: DC

## 2020-06-21 NOTE — Discharge Instructions (Addendum)

## 2020-06-21 NOTE — ED Provider Notes (Signed)
[facility] EMERGENCY DEPARTMENT Provider Note   CSN: [id number] Arrival date & time: 06/20/20  2023     History Chief Complaint  Patient presents with  . Ingestion    Margaret Bishop is a 70 y.o. adult.  The history is provided by the patient and a relative.  Altered Mental Status Presenting symptoms: behavior changes   Severity:  Mild Most recent episode:  Today Progression:  Improving Chronicity:  New Context: not drug use and not head injury   Associated symptoms: no abdominal pain, no fever, no headaches, no slurred speech, no vomiting and no weakness   Patient with history of bipolar, GERD, DVT presents for concern for change in mental status.  Earlier in the day, it is reported that her speech pattern was different.  Son reports that the speed of her speech will change at times.  There is no slurred speech.  There is been no focal weakness.  No falls or injuries. Son also reports that her behavior has been different as well. She is now improving. Patient denies any drug ingestion or misuse of her medications.  However her medication schedule has been off recently because her mother passed away this month.  Patient reports since that time she has been very busy taking care of her mother's affairs and dealing with family.  Her sleep is also been disturbed   She does not take Xanax.  Otherwise she typically takes her medications as prescribed Past Medical History:  Diagnosis Date  . Abdominal hernia   . Arthritis   . Bipolar disorder ([place])   . GERD (gastroesophageal reflux disease)   . History of kidney stones   . Hx of blood clots    Left leg  . Hypertension   . Shortness of breath dyspnea    with exertion    Patient Active Problem List   Diagnosis Date Noted  . Warfarin anticoagulation 06/06/2015  . Encounter for screening colonoscopy 06/06/2015    Past Surgical History:  Procedure Laterality Date  . CATARACT EXTRACTION W/PHACO Left 08/10/2019   Procedure:  CATARACT EXTRACTION PHACO AND INTRAOCULAR LENS PLACEMENT LEFT EYE;  Surgeon: Baruch Goldmann, MD;  Location: [facility];  Service: Ophthalmology;  Laterality: Left;  CDE: 6.03  . CATARACT EXTRACTION W/PHACO Right 08/24/2019   Procedure: CATARACT EXTRACTION PHACO AND INTRAOCULAR LENS PLACEMENT (IOC);  Surgeon: Baruch Goldmann, MD;  Location: [facility];  Service: Ophthalmology;  Laterality: Right;  CDE: 5.18  . CHOLECYSTECTOMY    . COLONOSCOPY N/A 06/20/2015   Procedure: COLONOSCOPY;  Surgeon: Danie Binder, MD;  [facility]: [facility];  Service: Endoscopy;  Laterality: N/A;  1100  . INSERTION OF MESH N/A 11/30/2014   Procedure: INSERTION OF MESH;  Surgeon: Ralene Ok, MD;  Location: [place];  Service: General;  Laterality: N/A;  . KNEE ARTHROPLASTY Left   . UMBILICAL HERNIA REPAIR N/A 11/30/2014   Procedure: LAPAROSCOPIC UMBILICAL HERNIA REPAIR WITH MESH;  Surgeon: Ralene Ok, MD;  Location: [place];  Service: General;  Laterality: N/A;     OB History   No obstetric history on file.     Family History  Problem Relation Age of Onset  . Colon cancer Neg Hx     Social History   Tobacco Use  . Smoking status: Never Smoker  . Smokeless tobacco: Never Used  Substance Use Topics  . Alcohol use: No    Alcohol/week: 0.0 standard drinks  . Drug use: No    Home Medications Prior to Admission  medications   Medication Sig Start Date End Date Taking? Authorizing Provider  acetaminophen (TYLENOL) 500 MG tablet Take 500 mg by mouth as needed for mild pain.    [provider]  albuterol (PROVENTIL HFA;VENTOLIN HFA) 108 (90 BASE) MCG/ACT inhaler Inhale 1 puff into the lungs as needed for wheezing.    [provider]  amLODipine-olmesartan (AZOR) 5-40 MG per tablet Take 1 tablet by mouth daily.    [provider]  lamoTRIgine (LAMICTAL) 25 MG tablet Take 25 mg by mouth daily.    [provider]  lithium 300 MG tablet Take 300 mg by mouth daily.     [provider]  omeprazole (PRILOSEC) 20 MG capsule Take 20 mg by mouth daily.    [provider]  vitamin B-12 (CYANOCOBALAMIN) 500 MCG tablet Take 500 mcg by mouth daily.    [provider]  warfarin (COUMADIN) 4 MG tablet Take 4 mg by mouth daily.    [provider]    Allergies    Onion, Shellfish allergy, Strawberry extract, and Tomato  Review of Systems   Review of Systems  Constitutional: Negative for fever.  Respiratory: Negative for shortness of breath.   Cardiovascular: Negative for chest pain.  Gastrointestinal: Negative for abdominal pain and vomiting.  Genitourinary: Negative for dysuria.  Neurological: Negative for weakness, numbness and headaches.  Psychiatric/Behavioral: Positive for sleep disturbance.  All other systems reviewed and are negative.   Physical Exam Updated Vital Signs BP (!) 147/73   Pulse 76   Temp 98.6 F (37 C) (Oral)   Resp (!) 22   Ht 1.715 m (5' 7.5")   Wt 90.7 kg   SpO2 98%   BMI 30.86 kg/m   Physical Exam CONSTITUTIONAL: Well developed/well nourished HEAD: Normocephalic/atraumatic EYES: EOMI/PERRL, no nystagmus,   no ptosis ENMT: Mucous membranes moist NECK: supple no meningeal signs CV: S1/S2 noted, no murmurs/rubs/gallops noted LUNGS: Lungs are clear to auscultation bilaterally, no apparent distress ABDOMEN: soft, nontender, no rebound or guarding GU:no cva tenderness NEURO:Awake/alert, face symmetric, no arm or leg drift is noted Equal 5/5 strength with shoulder abduction, elbow flex/extension, wrist flex/extension in upper extremities and equal hand grips bilaterally Equal 5/5 strength with hip flexion,knee flex/extension, foot dorsi/plantar flexion Cranial nerves 3/4/5/6/08/06/08/11/12 tested and intact No past pointing Sensation to light touch intact in all extremities Speech is clear, no dysarthria.  No aphasia EXTREMITIES: pulses normal, full ROM SKIN: warm, color normal PSYCH: Patient appears  anxious  ED Results / Procedures / Treatments   Labs (all labs ordered are listed, but only abnormal results are displayed) Labs Reviewed  CBC WITH DIFFERENTIAL/PLATELET - Abnormal; Notable for the following components:      Result Value   RBC 3.81 (*)    Hemoglobin 11.2 (*)    MCHC 29.8 (*)    All other components within normal limits  COMPREHENSIVE METABOLIC PANEL - Abnormal; Notable for the following components:   Calcium 8.8 (*)    All other components within normal limits  URINALYSIS, ROUTINE W REFLEX MICROSCOPIC - Abnormal; Notable for the following components:   APPearance HAZY (*)    Ketones, ur 20 (*)    Leukocytes,Ua MODERATE (*)    Bacteria, UA RARE (*)    All other components within normal limits  PROTIME-INR - Abnormal; Notable for the following components:   Prothrombin Time 15.3 (*)    All other components within normal limits  LITHIUM LEVEL - Abnormal; Notable for the following components:  Lithium Lvl 0.42 (*)    All other components within normal limits  ACETAMINOPHEN LEVEL - Abnormal; Notable for the following components:   Acetaminophen (Tylenol), Serum <10 (*)    All other components within normal limits  SALICYLATE LEVEL - Abnormal; Notable for the following components:   Salicylate Lvl <4.8 (*)    All other components within normal limits  AMMONIA  RAPID URINE DRUG SCREEN, HOSP PERFORMED    EKG EKG Interpretation  Date/Time:  Tuesday Jun 21 2020 01:37:08 EDT Ventricular Rate:  97 PR Interval:  176 QRS Duration: 107 QT Interval:  433 QTC Calculation: 418 R Axis:   25 Text Interpretation: Sinus rhythm Ventricular bigeminy Nonspecific T abnormalities, lateral leads Confirmed by Ripley Fraise [phone]) on 06/21/2020 2:02:38 AM   Radiology No results found.  Procedures Procedures   Medications Ordered in ED Medications - No data to display  ED Course  I have reviewed the triage vital signs and the nursing notes.  Pertinent labs &  imaging results that were available during my care of the patient were reviewed by me and considered in my medical decision making (see chart for details).    MDM Rules/Calculators/A&P                          2:08 AM Patient presents from home for concern for change in mental status as well as her speech pattern.  At this time, there is no signs of acute stroke.  Patient does change her speech pattern at times during the exam, but there is no dysarthria or aphasia. Her sleep has been disturbed as well as her medication schedule due to the loss of her mother.  This could be playing a role in her symptoms. Due to her age, history as well as high risk medications, imaging and labs are pending at this time.  At this point there does not seem to be any concern for self injury   Patient was monitored for several hours in no acute distress.  She reports she is ambulatory She appears at her baseline. She is not lithium toxic.  Her INR is subtherapeutic, advised patient to continue her medications as she now admits she may have missed some recent doses.  She reports she has follow-up with new PCP on June 1. She may have an early urinary tract infection, will start antibiotics. After long discussion with patient and her son, they both feel she is at baseline. Suspect some of her changes earlier may be due to her underlying mental health disorder/bipolar  EKG did show bigeminy, but no other acute findings  We discussed strict return precaution Final Clinical Impression(s) / ED Diagnoses Final diagnoses:  Transient alteration of awareness  Acute cystitis without hematuria    Rx / DC Orders ED Discharge Orders         Ordered    cephALEXin (KEFLEX) 500 MG capsule  2 times daily        06/21/20 0427           Ripley Fraise, MD 06/21/20 [phone]

## 2020-06-21 NOTE — ED Notes (Signed)
Patient transported to CT 

## 2020-06-23 ENCOUNTER — Other Ambulatory Visit: Payer: Self-pay

## 2020-06-23 ENCOUNTER — Encounter (HOSPITAL_COMMUNITY): Payer: Self-pay | Admitting: Emergency Medicine

## 2020-06-23 ENCOUNTER — Emergency Department (HOSPITAL_COMMUNITY)
Admission: EM | Admit: 2020-06-23 | Discharge: 2020-06-26 | Disposition: A | Discharge status: Home / Self Care | Payer: Medicare Other | Attending: Emergency Medicine | Admitting: Emergency Medicine

## 2020-06-23 DIAGNOSIS — F3111 Bipolar disorder, current episode manic without psychotic features, mild: Secondary | ICD-10-CM

## 2020-06-23 DIAGNOSIS — I10 Essential (primary) hypertension: Secondary | ICD-10-CM | POA: Diagnosis not present

## 2020-06-23 DIAGNOSIS — F3112 Bipolar disorder, current episode manic without psychotic features, moderate: Secondary | ICD-10-CM | POA: Diagnosis not present

## 2020-06-23 DIAGNOSIS — Z79899 Other long term (current) drug therapy: Secondary | ICD-10-CM | POA: Insufficient documentation

## 2020-06-23 DIAGNOSIS — R4182 Altered mental status, unspecified: Secondary | ICD-10-CM | POA: Diagnosis not present

## 2020-06-23 DIAGNOSIS — F29 Unspecified psychosis not due to a substance or known physiological condition: Secondary | ICD-10-CM | POA: Diagnosis present

## 2020-06-23 DIAGNOSIS — U071 COVID-19: Secondary | ICD-10-CM | POA: Diagnosis not present

## 2020-06-23 DIAGNOSIS — Z7901 Long term (current) use of anticoagulants: Secondary | ICD-10-CM | POA: Insufficient documentation

## 2020-06-23 DIAGNOSIS — F23 Brief psychotic disorder: Secondary | ICD-10-CM | POA: Insufficient documentation

## 2020-06-23 DIAGNOSIS — R9431 Abnormal electrocardiogram [ECG] [EKG]: Secondary | ICD-10-CM

## 2020-06-23 DIAGNOSIS — R410 Disorientation, unspecified: Secondary | ICD-10-CM | POA: Insufficient documentation

## 2020-06-23 DIAGNOSIS — Z96652 Presence of left artificial knee joint: Secondary | ICD-10-CM | POA: Diagnosis not present

## 2020-06-23 DIAGNOSIS — R778 Other specified abnormalities of plasma proteins: Secondary | ICD-10-CM

## 2020-06-23 DIAGNOSIS — F319 Bipolar disorder, unspecified: Secondary | ICD-10-CM | POA: Diagnosis present

## 2020-06-23 LAB — COMPREHENSIVE METABOLIC PANEL
ALT: 16 U/L (ref 0–44)
AST: 27 U/L (ref 15–41)
Albumin: 4.1 g/dL (ref 3.5–5.0)
Alkaline Phosphatase: 43 U/L (ref 38–126)
Anion gap: 7 (ref 5–15)
BUN: 7 mg/dL — ABNORMAL LOW (ref 8–23)
CO2: 24 mmol/L (ref 22–32)
Calcium: 9.2 mg/dL (ref 8.9–10.3)
Chloride: 105 mmol/L (ref 98–111)
Creatinine, Ser: 0.67 mg/dL (ref 0.44–1.00)
GFR, Estimated: >60 mL/min (ref >60)
Glucose, Bld: 118 mg/dL — ABNORMAL HIGH (ref 70–99)
Potassium: 3.6 mmol/L (ref 3.5–5.1)
Sodium: 136 mmol/L (ref 135–145)
Total Bilirubin: 0.6 mg/dL (ref 0.3–1.2)
Total Protein: 7.7 g/dL (ref 6.5–8.1)

## 2020-06-23 LAB — URINALYSIS, ROUTINE W REFLEX MICROSCOPIC
Bilirubin Urine: NEGATIVE
Crystals: NONE SEEN — AB
Glucose, UA: NEGATIVE mg/dL
Ketones, ur: NEGATIVE mg/dL
Leukocytes,Ua: NEGATIVE
Nitrite: NEGATIVE
Protein, ur: NEGATIVE mg/dL
Specific Gravity, Urine: 1.002 — ABNORMAL LOW (ref 1.005–1.030)
pH: 7 (ref 5.0–8.0)

## 2020-06-23 LAB — CBC
HCT: 39.7 % (ref 36.0–46.0)
Hemoglobin: 11.7 g/dL — ABNORMAL LOW (ref 12.0–15.0)
MCH: 29 pg (ref 26.0–34.0)
MCHC: 29.5 g/dL — ABNORMAL LOW (ref 30.0–36.0)
MCV: 98.5 fL (ref 80.0–100.0)
Platelets: 247 10*3/uL (ref 150–400)
RBC: 4.03 MIL/uL (ref 3.87–5.11)
RDW: 12.5 % (ref 11.5–15.5)
WBC: 6.3 10*3/uL (ref 4.0–10.5)
nRBC: 0 % (ref 0.0–0.2)

## 2020-06-23 LAB — RAPID URINE DRUG SCREEN, HOSP PERFORMED
Amphetamines: NOT DETECTED
Barbiturates: NOT DETECTED
Benzodiazepines: NOT DETECTED
Cocaine: NOT DETECTED
Opiates: NOT DETECTED
Tetrahydrocannabinol: NOT DETECTED

## 2020-06-23 LAB — TROPONIN I (HIGH SENSITIVITY)
Troponin I (High Sensitivity): 112 ng/L (ref <18)
Troponin I (High Sensitivity): 88 ng/L — ABNORMAL HIGH (ref <18)

## 2020-06-23 LAB — RESP PANEL BY RT-PCR (FLU A&B, COVID) ARPGX2
Influenza A by PCR: NEGATIVE
Influenza B by PCR: NEGATIVE
SARS Coronavirus 2 by RT PCR: POSITIVE — AB

## 2020-06-23 LAB — ETHANOL: Alcohol, Ethyl (B): <10 mg/dL (ref <10)

## 2020-06-23 LAB — PROTIME-INR
INR: 1.2 (ref 0.8–1.2)
Prothrombin Time: 14.9 seconds (ref 11.4–15.2)

## 2020-06-23 LAB — LITHIUM LEVEL: Lithium Lvl: 0.43 mmol/L — ABNORMAL LOW (ref 0.60–1.20)

## 2020-06-23 NOTE — ED Notes (Signed)
Pt belongings removed from patient. Clothing, purse, shoes, medications ( no narcotics), jewelry removed from pt.  Jewelry includes 2 bracelets with multi colored beads 2 yellow colored bracelets 2 white colored necklaces  1 white ring with black stone 1 white color ring with clear and black stones 1 yellow colored ring with pink stone 1 yellow color ring with clear and red stones 2 yellow color tings with blue stones 4 yellow color rings with white stones 1 yellow color ring with light blue stone   Pt still has on 3 rings in which she can not phasically remove.  All items placed in locker.

## 2020-06-23 NOTE — ED Notes (Signed)
TTS in progress 

## 2020-06-23 NOTE — ED Provider Notes (Signed)
The patient has an abnormal EKG, the troponin is trending down, she has no chest pain, she has no tachycardia or hypoxia or fever.  She has a COVID-positive test and is now currently on her precautions.  Psychiatric consultation was requested 6 hours ago and is yet to be formally completed.  We will request that they expedite this evaluation given the length of time this patient has been in the emergency department.  Pt has been cleared medically for psychiatric disposition.  Trop is improved, no CP at this time - hospitalist does not think pt needs medical admission, awaiting psychiatric recommendations   Noemi Chapel, MD 06/23/20 2205

## 2020-06-23 NOTE — ED Provider Notes (Addendum)
[facility] EMERGENCY DEPARTMENT Provider Note   CSN: [id number] Arrival date & time: 06/23/20  1221     History Chief Complaint  Patient presents with  . Altered Mental Status    Margaret Bishop is a 70 y.o. adult.  Patient with hx bipolar presents w 'acting/talking odd' for past few days. Symptoms acute onset, moderate, persistent. Son indicates recent increased stress, with death of patients mom this month. Indicates during that time, patient without her normal meds for a couple days. Pt limited historian, limited insight into current symptoms - level 5 caveat.  Pt indicates she feels fine, no physical symptoms, states she is here b/c the government, and other 'flim-flammers' want her to, and then talks about on unrelated topic, then another. No report of trauma or fall. No report of fevers. No hx etoh/drug abuse.   The history is provided by the patient and a relative. The history is limited by the condition of the patient.  Altered Mental Status Presenting symptoms: confusion   Associated symptoms: no abdominal pain, no fever, no headaches, no rash and no vomiting        Past Medical History:  Diagnosis Date  . Abdominal hernia   . Arthritis   . Bipolar disorder ([place])   . GERD (gastroesophageal reflux disease)   . History of kidney stones   . Hx of blood clots    Left leg  . Hypertension   . Shortness of breath dyspnea    with exertion    Patient Active Problem List   Diagnosis Date Noted  . Warfarin anticoagulation 06/06/2015  . Encounter for screening colonoscopy 06/06/2015    Past Surgical History:  Procedure Laterality Date  . CATARACT EXTRACTION W/PHACO Left 08/10/2019   Procedure: CATARACT EXTRACTION PHACO AND INTRAOCULAR LENS PLACEMENT LEFT EYE;  Surgeon: Baruch Goldmann, MD;  Location: [facility];  Service: Ophthalmology;  Laterality: Left;  CDE: 6.03  . CATARACT EXTRACTION W/PHACO Right 08/24/2019   Procedure: CATARACT EXTRACTION PHACO AND INTRAOCULAR LENS  PLACEMENT (IOC);  Surgeon: Baruch Goldmann, MD;  Location: [facility];  Service: Ophthalmology;  Laterality: Right;  CDE: 5.18  . CHOLECYSTECTOMY    . COLONOSCOPY N/A 06/20/2015   Procedure: COLONOSCOPY;  Surgeon: Danie Binder, MD;  [facility];  Service: Endoscopy;  Laterality: N/A;  1100  . INSERTION OF MESH N/A 11/30/2014   Procedure: INSERTION OF MESH;  Surgeon: Ralene Ok, MD;  Location: [place];  Service: General;  Laterality: N/A;  . KNEE ARTHROPLASTY Left   . UMBILICAL HERNIA REPAIR N/A 11/30/2014   Procedure: LAPAROSCOPIC UMBILICAL HERNIA REPAIR WITH MESH;  Surgeon: Ralene Ok, MD;  Location: [place];  Service: General;  Laterality: N/A;     OB History   No obstetric history on file.     Family History  Problem Relation Age of Onset  . Colon cancer Neg Hx     Social History   Tobacco Use  . Smoking status: Never Smoker  . Smokeless tobacco: Never Used  Substance Use Topics  . Alcohol use: No    Alcohol/week: 0.0 standard drinks  . Drug use: No    Home Medications Prior to Admission medications   Medication Sig Start Date End Date Taking? Authorizing Provider  acetaminophen (TYLENOL) 500 MG tablet Take 500 mg by mouth as needed for mild pain.    [provider]  albuterol (PROVENTIL HFA;VENTOLIN HFA) 108 (90 BASE) MCG/ACT inhaler Inhale 1 puff into the lungs as needed for wheezing.  [provider]  amLODipine-olmesartan (AZOR) 5-40 MG per tablet Take 1 tablet by mouth daily.    [provider]  cephALEXin (KEFLEX) 500 MG capsule Take 1 capsule (500 mg total) by mouth 2 (two) times daily. 06/21/20   Ripley Fraise, MD  lamoTRIgine (LAMICTAL) 25 MG tablet Take 25 mg by mouth daily.    [provider]  lithium 300 MG tablet Take 300 mg by mouth daily.     [provider]  omeprazole (PRILOSEC) 20 MG capsule Take 20 mg by mouth daily.    [provider]  vitamin B-12 (CYANOCOBALAMIN) 500 MCG tablet  Take 500 mcg by mouth daily.    [provider]  warfarin (COUMADIN) 4 MG tablet Take 4 mg by mouth daily.    [provider]    Allergies    Onion, Shellfish allergy, Strawberry extract, and Tomato  Review of Systems   Review of Systems  Constitutional: Negative for chills and fever.  HENT: Negative for sore throat.   Eyes: Negative for visual disturbance.  Respiratory: Negative for cough and shortness of breath.   Cardiovascular: Negative for chest pain.  Gastrointestinal: Negative for abdominal pain, diarrhea and vomiting.  Genitourinary: Negative for dysuria and flank pain.  Musculoskeletal: Negative for back pain and neck pain.  Skin: Negative for rash.  Neurological: Negative for headaches.  Hematological: Does not bruise/bleed easily.  Psychiatric/Behavioral: Positive for confusion. Negative for self-injury and suicidal ideas.    Physical Exam Updated Vital Signs BP (!) 134/93 (BP Location: Left Arm)   Pulse 95   Temp 98.1 F (36.7 C) (Oral)   Resp 17   Ht 1.715 m (5' 7.5")   Wt 90.7 kg   SpO2 100%   BMI 30.86 kg/m   Physical Exam Vitals and nursing note reviewed.  Constitutional:      Appearance: Normal appearance. She is well-developed.  HENT:     Head: Atraumatic.     Nose: Nose normal.     Mouth/Throat:     Mouth: Mucous membranes are moist.     Pharynx: Oropharynx is clear.  Eyes:     General: No scleral icterus.    Conjunctiva/sclera: Conjunctivae normal.     Pupils: Pupils are equal, round, and reactive to light.  Neck:     Vascular: No carotid bruit.     Trachea: No tracheal deviation.     Comments: No stiffness or rigidity.  Cardiovascular:     Rate and Rhythm: Normal rate and regular rhythm.     Pulses: Normal pulses.     Heart sounds: Normal heart sounds. No murmur heard. No friction rub. No gallop.   Pulmonary:     Effort: Pulmonary effort is normal. No accessory muscle usage or respiratory distress.     Breath sounds:  Normal breath sounds.  Abdominal:     General: Bowel sounds are normal. There is no distension.     Palpations: Abdomen is soft.     Tenderness: There is no abdominal tenderness. There is no guarding.  Genitourinary:    Comments: No cva tenderness. Musculoskeletal:        General: No swelling or tenderness.     Cervical back: Normal range of motion and neck supple. No rigidity.     Right lower leg: No edema.     Left lower leg: No edema.  Skin:    General: Skin is warm and dry.     Findings: No rash.  Neurological:  Mental Status: She is alert.     Comments: Alert, speech clear, no dysarthria or aphasia. Is oriented to person, place, day, month/year.  Motor intact bil, stre 5/5. No pronator drift. Sens grossly intact bil.   Psychiatric:     Comments: Pt rapidly moves from one unrelated thought to next, talks about government and other con artists, then about tv programs sending messages to her/family. ?manic. Denies thoughts of harm to self or others.      ED Results / Procedures / Treatments   Labs (all labs ordered are listed, but only abnormal results are displayed) Results for orders placed or performed during the hospital encounter of 06/23/20  Comprehensive metabolic panel  Result Value Ref Range   Sodium 136 135 - 145 mmol/L   Potassium 3.6 3.5 - 5.1 mmol/L   Chloride 105 98 - 111 mmol/L   CO2 24 22 - 32 mmol/L   Glucose, Bld 118 (H) 70 - 99 mg/dL   BUN 7 (L) 8 - 23 mg/dL   Creatinine, Ser 0.67 0.44 - 1.00 mg/dL   Calcium 9.2 8.9 - 10.3 mg/dL   Total Protein 7.7 6.5 - 8.1 g/dL   Albumin 4.1 3.5 - 5.0 g/dL   AST 27 15 - 41 U/L   ALT 16 0 - 44 U/L   Alkaline Phosphatase 43 38 - 126 U/L   Total Bilirubin 0.6 0.3 - 1.2 mg/dL   GFR, Estimated >60 >60 mL/min   Anion gap 7 5 - 15  CBC  Result Value Ref Range   WBC 6.3 4.0 - 10.5 K/uL   RBC 4.03 3.87 - 5.11 MIL/uL   Hemoglobin 11.7 (L) 12.0 - 15.0 g/dL   HCT 39.7 36.0 - 46.0 %   MCV 98.5 80.0 - 100.0 fL   MCH  29.0 26.0 - 34.0 pg   MCHC 29.5 (L) 30.0 - 36.0 g/dL   RDW 12.5 11.5 - 15.5 %   Platelets 247 150 - 400 K/uL   nRBC 0.0 0.0 - 0.2 %  Lithium level  Result Value Ref Range   Lithium Lvl 0.43 (L) 0.60 - 1.20 mmol/L  Ethanol  Result Value Ref Range   Alcohol, Ethyl (B) <10 <10 mg/dL  Rapid urine drug screen (hospital performed)  Result Value Ref Range   Opiates NONE DETECTED NONE DETECTED   Cocaine NONE DETECTED NONE DETECTED   Benzodiazepines NONE DETECTED NONE DETECTED   Amphetamines NONE DETECTED NONE DETECTED   Tetrahydrocannabinol NONE DETECTED NONE DETECTED   Barbiturates NONE DETECTED NONE DETECTED  Urinalysis, Routine w reflex microscopic  Result Value Ref Range   Color, Urine COLORLESS (A) YELLOW   APPearance CLEAR CLEAR   Specific Gravity, Urine 1.002 (L) 1.005 - 1.030   pH 7.0 5.0 - 8.0   Glucose, UA NEGATIVE NEGATIVE mg/dL   Hgb urine dipstick SMALL (A) NEGATIVE   Bilirubin Urine NEGATIVE NEGATIVE   Ketones, ur NEGATIVE NEGATIVE mg/dL   Protein, ur NEGATIVE NEGATIVE mg/dL   Nitrite NEGATIVE NEGATIVE   Leukocytes,Ua NEGATIVE NEGATIVE   RBC / HPF 0-5 0 - 5 RBC/hpf   WBC, UA 0-5 0 - 5 WBC/hpf   Bacteria, UA RARE (A) NONE SEEN   Squamous Epithelial / LPF 0-5 0 - 5   Crystals NONE SEEN (A) NEGATIVE  Troponin I (High Sensitivity)  Result Value Ref Range   Troponin I (High Sensitivity) 112 (HH) <18 ng/L   CT Head Wo Contrast  Result Date: 06/21/2020 CLINICAL DATA:  Delirium. Son believes  pt is taking her xanax more than prescribed since her mother passed on 2022-06-18 AMS EXAM: CT HEAD WITHOUT CONTRAST TECHNIQUE: Contiguous axial images were obtained from the base of the skull through the vertex without intravenous contrast. COMPARISON:  CT head 06/2010 FINDINGS: Brain: Cerebral ventricle sizes are concordant with the degree of cerebral volume loss. No evidence of large-territorial acute infarction. No parenchymal hemorrhage. No mass lesion. No extra-axial collection. No mass  effect or midline shift. No hydrocephalus. Basilar cisterns are patent. Vascular: No hyperdense vessel. Skull: No acute fracture or focal lesion. Sinuses/Orbits: Paranasal sinuses and mastoid air cells are clear. The orbits are unremarkable. Other: None. IMPRESSION: No acute intracranial abnormality. Electronically Signed   By: Iven Finn M.D.   On: 06/21/2020 02:18    EKG EKG Interpretation  Date/Time:  Thursday Jun 23 2020 12:56:04 EDT Ventricular Rate:  84 PR Interval:  172 QRS Duration: 106 QT Interval:  445 QTC Calculation: 527 R Axis:   20 Text Interpretation: Sinus rhythm Nonspecific T wave abnormality Prolonged QT interval Confirmed by Lajean Saver [id number]) on 06/23/2020 12:58:57 PM   Radiology No results found.  Procedures Procedures   Medications Ordered in ED Medications - No data to display  ED Course  I have reviewed the triage vital signs and the nursing notes.  Pertinent labs & imaging results that were available during my care of the patient were reviewed by me and considered in my medical decision making (see chart for details).    MDM Rules/Calculators/A&P                         Labs sent.   Reviewed nursing notes and prior charts for additional history.  Recent ED visit for same symptoms relieved - labs and ct then largely unremarkable.   Will get [facility] evaluations ?with recent stress, loss of mother, missing recent meds, ?possible manic symptoms/episode.   Labs reviewed/interpreted by me - wbc normal, hct normal. ua neg for infection.   BH evaluation pending.   Will sign out to oncoming EDP to f/u with [facility] evaluation, and dispo appropriately. Also awaiting med rec from pharmacy to start home po meds.   The patient has been placed in psychiatric observation due to the need to provide a safe environment for the patient while obtaining psychiatric consultation and evaluation, as well as ongoing medical and medication management to treat the patient's  condition.    Patient had ecg on arrival - it is abnormal, and appears changed from prior. Pt denies any current or recent chest pain or discomfort - although pts mental status makes her a challenging and ?possibly less than reliable historian. Initial trop is elevated. Pt continues to deny chest pain. Given abn ecg, elevated trop - will consult/discuss w cardiology.  Discussed pt with Dr Timmothy Sours, cardiology, who reviewed current and prior ecgs, trop, etc - she indicates given absence of cp, feels likely related to LVH, and that no additional ED/inpatient workup needed, and that as outpt pt can f/u in office and get outpt stress/echo.  Recheck pt no chest pain. Delta trop pending. Signed out to Dr Sabra Heck at 1540 to check delta trop, and follow up on [facility] evaluation, and dispo appropriately.      Final Clinical Impression(s) / ED Diagnoses Final diagnoses:  None    Rx / DC Orders ED Discharge Orders    None         Lajean Saver, MD 06/23/20 1544

## 2020-06-23 NOTE — BH Assessment (Addendum)
Comprehensive Clinical Assessment (CCA) Note  06/23/2020 Margaret Bishop [id number]  Chief Complaint:  Chief Complaint  Patient presents with  . Altered Mental Status   Visit Diagnosis:  Acute psychosis  Disposition: Per Darrol Angel, NP pt to be observed overnight with provider reassessment in the AM.  RN and EDP notified of disposition.  [place] ED from 06/23/2020 in [place] [facility] from 06/20/2020 in [place] No Risk No Risk     The patient demonstrates the following risk factors for suicide: Chronic risk factors for suicide include: psychiatric disorder of bipolar disorder and medical illness recent UTI. Acute risk factors for suicide include: loss (financial, interpersonal, professional). Protective factors for this patient include: positive therapeutic relationship and responsibility to others (children, family). Considering these factors, the overall suicide risk at this point appears to be low. Patient is not appropriate for outpatient follow up.  Pt is 70 yo adult presenting voluntarily to [facility] for evaluation of altered mental status. Pt was in the hospital 2 days ago and was treated for UTI. Pt is still taking medication prescribed for UTI per pt's son. Pt denies current SI, HI or history of violence. Pt denies current psychiatrist or counselor. Pt has history of bipolar disorder. Pt reports auditory & visual hallucinations or other symptoms of psychosis throughout assessment. Pt was continuously pointing to "people" coming in and out of the room making faces at her. Pt also stated that the "international FBI" had headquarters in [place] and that they are keeping an eye on people. Pt states current stressors include recent loss of mother.Pt lives alone , and supports include her son. 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  Pt has poor insight and poor judgment.  Protective factors against  suicide include good family support, no current suicidal ideation, future orientation, no access to firearms, and no prior attempts. Pt's OP history includes psychiatric treatment years ago "I haven't been to a psychiatrist in years" . IP history includes one inpatient hospitalization at [facility] 10 years ago..Pt reports occasional alcohol use "just at holidays" Pt denies that she is currently a danger to self--pts son feels that pt could be a danger to herself or others   Collateral:  son: Margaret Bishop. Delfina Redwood reports that his mother was doing better after release from the hospital on 5/24, but symptoms escalated this morning when his mother started talking about the FBI coming to the home and talking about "they are coming". Delfina Redwood feels that his mother has not recovered from the UTI earlier this week and is also experiencing stress after the recent loss of her mother. Delfina Redwood stated that his mother came to his house saying that she was worried about him "you have a lot of guns and I'm afraid you are going to hurt yourself". Delfina Redwood then brought his mother to [facility]   CCA Screening, Triage and Referral (STR)  Patient Reported Information How did you hear about [place]? Family/Friend  Referral name: son: Margaret Bishop  Referral phone number: No data recorded  Whom do you see for routine medical problems? No data recorded Practice/Facility Name: No data recorded Practice/Facility Phone Number: No data recorded Name of Contact: No data recorded Contact Number: No data recorded Contact Fax Number: No data recorded Prescriber Name: No data recorded Prescriber Address (if known): No data recorded  What Is the Reason for Your Visit/Call Today? No data recorded How Long Has This Been Causing You  Problems? <Week  What Do You Feel Would Help You the Most Today? Treatment for Depression or other mood problem   Have You Recently Been in Any Inpatient Treatment (Hospital/Detox/Crisis Center/28-Day  Program)? Yes  Name/Location of Program/Hospital:[facility]  How Long Were You There? 2 days  When Were You Discharged? 06/21/2020   Have You Ever Received Services From [employer] Before? Yes  Who Do You See at [facility]? ED   Have You Recently Had Any Thoughts About Hurting Yourself? No  Are You Planning to Commit Suicide/Harm Yourself At This time? No   Have you Recently Had Thoughts About [place]? No  Explanation: No data recorded  Have You Used Any Alcohol or Drugs in the Past 24 Hours? No  How Long Ago Did You Use Drugs or Alcohol? No data recorded What Did You Use and How Much? No data recorded  Do You Currently Have a Therapist/Psychiatrist? No  Name of Therapist/Psychiatrist: No data recorded  Have You Been Recently Discharged From Any Office Practice or Programs? No  Explanation of Discharge From Practice/Program: No data recorded    CCA Screening Triage Referral Assessment Type of Contact: Tele-Assessment  Is this Initial or Reassessment? Initial Assessment  Date Telepsych consult ordered in [facility]:  06/23/2020  Time Telepsych consult ordered in [facility]:  1309   Patient Reported Information Reviewed? Yes  Patient Left Without Being Seen? No data recorded Reason for Not Completing Assessment: No data recorded  Collateral Involvement: son: Dennie Vecchio.  Delfina Redwood reports that his mother was doing better after release from the hospital on 5/24, but symptoms escalated this morning when his mother started talking about the FBI coming to the home and talking about "they are coming". Delfina Redwood feels that his mother has not recovered from the UTI earlier this week and is also experiencing stress after the recent loss of her mother. Delfina Redwood stated that his mother came to his house saying that she was worried about him "you have a lot of guns and I'm afraid you are going to hurt yourself". Delfina Redwood then brought his mother to [facility].   Does Patient Have a Editor, commissioning Guardian? No data recorded Name and Contact of Legal Guardian: No data recorded If Minor and Not Living with Parent(s), Who has Custody? No data recorded Is CPS involved or ever been involved? Never  Is APS involved or ever been involved? Never   Patient Determined To Be At Risk for Harm To Self or Others Based on Review of Patient Reported Information or Presenting Complaint? No  Method: No data recorded Availability of Means: No data recorded Intent: No data recorded Notification Required: No data recorded Additional Information for Danger to Others Potential: No data recorded Additional Comments for Danger to Others Potential: No data recorded Are There Guns or Other Weapons in Your Home? No data recorded Types of Guns/Weapons: No data recorded Are These Weapons Safely Secured?                            No data recorded Who Could Verify You Are Able To Have These Secured: No data recorded Do You Have any Outstanding Charges, Pending Court Dates, Parole/Probation? No data recorded Contacted To Inform of Risk of Harm To Self or Others: No data recorded  Location of Assessment: [facility]   Does Patient Present under Involuntary Commitment? No  IVC Papers Initial File Date: No data recorded  [place] of Residence: Margaret Bishop  Patient Currently Receiving the Following Services: Medication Management   Determination of Need: Emergent (2 hours)   Options For Referral: Other: Comment (overnight hospital observation with provider reassessment in the AM)     CCA Biopsychosocial Intake/Chief Complaint:  Pt is 70 yo adult presenting voluntarily to [facility] for evaluation of altered mental status. Pt was in the hospital 2 days ago and was treated for UTI. Pt is still taking medication prescribed for UTI per pt's son.  Pt denies current SI, HI or history of violence. Pt denies current psychiatrist or counselor. Pt has history of bipolar disorder.  Pt reports auditory &  visual hallucinations or other symptoms of psychosis throughout assessment. Pt was continuously pointing to "people" coming in and out of the room making faces at her. Pt also stated that the "international FBI" had headquarters in [place] and that they are keeping an eye on people.  Pt states current stressors include recent loss of mother.Pt lives alone , and supports include her son. 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  ?. Pt has poor?insight and poor judgment Protective factors against suicide include good family support, no current suicidal ideation, future orientation, no access to firearms, and no prior attempts.Pt's OP history includes psychiatric treatment years ago "I haven't been to a psychiatrist in years" . IP history includes one inpatient hospitalization at [facility] 10 years ago..Pt reports occasional alcohol use "just at holidays" Pt denies that she is currently a danger to self--pts son feels that pt could be a danger to herself or others.  Current Symptoms/Problems: delusional thought process   Patient Reported Schizophrenia/Schizoaffective Diagnosis in Past: No   Strengths: good family support  Preferences: psychiatric stabilization  Abilities: childcare role for years   Type of Services Patient Feels are Needed: pt wants stabilization   Initial Clinical Notes/Concerns: No data recorded  Mental Health Symptoms Depression:  Difficulty Concentrating   Duration of Depressive symptoms: Greater than two weeks   Mania:  Racing thoughts   Anxiety:   Worrying   Psychosis:  Delusions; Hallucinations   Duration of Psychotic symptoms: Less than six months   Trauma:  -- (recent loss of mother)   Obsessions:  None   Compulsions:  None   Inattention:  None   Hyperactivity/Impulsivity:  N/A   Oppositional/Defiant Behaviors:  None   Emotional Irregularity:  Mood lability   Other Mood/Personality Symptoms:  No data recorded   Mental  Status Exam Appearance and self-care  Stature:  Average   Weight:  Average weight   Clothing:  Neat/clean   Grooming:  Normal   Cosmetic use:  None   Posture/gait:  Slumped   Motor activity:  Not Remarkable   Sensorium  Attention:  Confused; Distractible   Concentration:  Focuses on irrelevancies; Scattered   Orientation:  Person; Place   Recall/memory:  Defective in Immediate; Defective in Short-term; Defective in Recent; Defective in Remote   Affect and Mood  Affect:  Anxious; Labile   Mood:  Anxious   Relating  Eye contact:  Normal   Facial expression:  Tense; Anxious   Attitude toward examiner:  Cooperative; Dramatic   Thought and Language  Speech flow: Clear and Coherent   Thought content:  Delusions; Suspicious   Preoccupation:  Ruminations (FBI, red rocking chair, seeing people in room)   Hallucinations:  Auditory; Visual (pt responding to internal stimuli throughout assessment)   Organization:  No data recorded  [employer] of Knowledge:  Fair  Intelligence:  Average   Abstraction:  -- (UTA)   Judgement:  Impaired   Reality Testing:  Variable   Insight:  Gaps   Decision Making:  Impulsive; Confused   Social Functioning  Social Maturity:  Impulsive; Irresponsible   Social Judgement:  Heedless; Impropriety   Stress  Stressors:  Grief/losses (recent loss of mother)   Coping Ability:  Overwhelmed; Deficient supports   Skill Deficits:  Self-control   Supports:  Family     Exercise/Diet: Exercise/Diet Do You Exercise?: No Have You Gained or Lost A Significant Amount of Weight in the Past Six Months?: No Do You Follow a Special Diet?: No Do You Have Any Trouble Sleeping?: No   CCA Employment/Education Employment/Work Situation: Employment / Work Copywriter, advertising Employment situation: Retired Where was the patient employed at that time?: Pt had her own daycare and worked all shifts Has patient ever been in the  [employer]?: Yes (Describe in comment) (*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, [employer], [employer], [employer])  Education: Education Is Patient Currently Attending School?: No Did Teacher, adult education From [employer]?: Yes Did Physicist, medical?: No Did Heritage manager?: No Did You Have An Individualized Education Program (IIEP): No Did You Have Any Difficulty At [employer]?: No Patient's Education Has Been Impacted by Current Illness: No   CCA Family/Childhood History Family and Relationship History:    Childhood History:  Childhood History By whom was/is the patient raised?: Mother Additional childhood history information: UTA Description of patient's relationship with caregiver when they were a child: UTA Patient's description of current relationship with people who raised him/her: UTA How were you disciplined when you got in trouble as a child/adolescent?: UTA Does patient have siblings?: Yes Number of Siblings: 6 Description of patient's current relationship with siblings: unstable--some conflict after death of mother Did patient suffer any verbal/emotional/physical/sexual abuse as a child?:  (uta) Did patient suffer from severe childhood neglect?:  (uta) Has patient ever been sexually abused/assaulted/raped as an adolescent or adult?:  (uta) Was the patient ever a victim of a crime or a disaster?:  (uta) Witnessed domestic violence?:  (uta) Has patient been affected by domestic violence as an adult?:  Special educational needs teacher)  Child/Adolescent Assessment:     CCA Substance Use Alcohol/Drug Use: Alcohol / Drug Use Pain Medications: see MAR Prescriptions: see MAR Over the Counter: see MAR History of alcohol / drug use?: Yes Substance #1 Name of Substance 1: xanax 1 - Amount (size/oz): variable 1 - Frequency: variable/daily 1 - Method of Aquiring: physician 1- Route of Use: oral Substance #2 Name of Substance 2: etoh 2 - Frequency: rarely    ASAM's:  Six  Dimensions of Multidimensional Assessment  Dimension 1:  Acute Intoxication and/or Withdrawal Potential:   Dimension 1:  Description of individual's past and current experiences of substance use and withdrawal: xanax; etoh  Dimension 2:  Biomedical Conditions and Complications:      Dimension 3:  Emotional, Behavioral, or Cognitive Conditions and Complications:     Dimension 4:  Readiness to Change:     Dimension 5:  Relapse, Continued use, or Continued Problem Potential:     Dimension 6:  Recovery/Living Environment:     ASAM Severity Score: ASAM's Severity Rating Score: 2  ASAM Recommended Level of Treatment:     Substance use Disorder (SUD)    Recommendations for Services/Supports/Treatments: Recommendations for Services/Supports/Treatments Recommendations For Services/Supports/Treatments: Other (Comment)  DSM5 Diagnoses: Patient Active Problem List   Diagnosis Date Noted  .  Acute psychosis ([place])   . Warfarin anticoagulation 06/06/2015  . Encounter for screening colonoscopy 06/06/2015   Referrals to Alternative Service(s): Referred to Alternative Service(s):   Place:   Date:   Time:    Referred to Alternative Service(s):   Place:   Date:   Time:    Referred to Alternative Service(s):   Place:   Date:   Time:    Referred to Alternative Service(s):   Place:   Date:   Time:     Rachel Bo Katelinn Justice, LCSW

## 2020-06-23 NOTE — ED Notes (Signed)
pts son made aware that pts belongings have been placed in the locker. Mr.Pedro states that he will not be picking them up and they are fine in the locker.

## 2020-06-23 NOTE — ED Triage Notes (Signed)
Son here with pt; reports AMS since 06/19/20; reports she has been taking her abx but has increased confusion and possible hallucinations

## 2020-06-24 DIAGNOSIS — F310 Bipolar disorder, current episode hypomanic: Secondary | ICD-10-CM

## 2020-06-24 DIAGNOSIS — F319 Bipolar disorder, unspecified: Secondary | ICD-10-CM | POA: Diagnosis present

## 2020-06-24 DIAGNOSIS — D126 Benign neoplasm of colon, unspecified: Secondary | ICD-10-CM | POA: Insufficient documentation

## 2020-06-24 DIAGNOSIS — K219 Gastro-esophageal reflux disease without esophagitis: Secondary | ICD-10-CM | POA: Insufficient documentation

## 2020-06-24 DIAGNOSIS — I10 Essential (primary) hypertension: Secondary | ICD-10-CM | POA: Insufficient documentation

## 2020-06-24 MED ORDER — WARFARIN SODIUM 5 MG PO TABS
5.0000 mg | ORAL_TABLET | Freq: Once | ORAL | Status: AC
  Administered 2020-06-24: 5 mg via ORAL
  Filled 2020-06-24: qty 1

## 2020-06-24 MED ORDER — LAMOTRIGINE 25 MG PO TABS
25.0000 mg | ORAL_TABLET | Freq: Every day | ORAL | Status: DC
  Administered 2020-06-24 – 2020-06-26 (×3): 25 mg via ORAL
  Filled 2020-06-24 (×3): qty 1

## 2020-06-24 MED ORDER — NIRMATRELVIR/RITONAVIR (PAXLOVID)TABLET
3.0000 | ORAL_TABLET | Freq: Two times a day (BID) | ORAL | Status: DC
  Administered 2020-06-24 – 2020-06-26 (×5): 3 via ORAL
  Filled 2020-06-24 (×2): qty 30

## 2020-06-24 MED ORDER — WARFARIN - PHARMACIST DOSING INPATIENT: Freq: Every day | Status: DC

## 2020-06-24 MED ORDER — PANTOPRAZOLE SODIUM 40 MG PO TBEC
40.0000 mg | DELAYED_RELEASE_TABLET | Freq: Every day | ORAL | Status: DC
  Administered 2020-06-24 – 2020-06-26 (×3): 40 mg via ORAL
  Filled 2020-06-24 (×3): qty 1

## 2020-06-24 MED ORDER — VITAMIN B-12 1000 MCG PO TABS: 500.0000 ug | ORAL_TABLET | Freq: Every day | ORAL | Status: DC

## 2020-06-24 MED ORDER — CEPHALEXIN 500 MG PO CAPS
500.0000 mg | ORAL_CAPSULE | Freq: Two times a day (BID) | ORAL | Status: DC
  Administered 2020-06-24 – 2020-06-26 (×5): 500 mg via ORAL
  Filled 2020-06-24 (×5): qty 1

## 2020-06-24 MED ORDER — AMLODIPINE-OLMESARTAN 5-40 MG PO TABS: 1.0000 | ORAL_TABLET | Freq: Every day | ORAL | Status: DC

## 2020-06-24 MED ORDER — LITHIUM CARBONATE 150 MG PO CAPS
300.0000 mg | ORAL_CAPSULE | Freq: Every day | ORAL | Status: DC
  Administered 2020-06-24 – 2020-06-26 (×3): 300 mg via ORAL
  Filled 2020-06-24 (×2): qty 2

## 2020-06-24 MED ORDER — LITHIUM CARBONATE 150 MG PO CAPS
300.0000 mg | ORAL_CAPSULE | Freq: Every day | ORAL | Status: DC
  Filled 2020-06-24: qty 2

## 2020-06-24 MED ORDER — IRBESARTAN 150 MG PO TABS
300.0000 mg | ORAL_TABLET | Freq: Every day | ORAL | Status: DC
  Administered 2020-06-24 – 2020-06-26 (×3): 300 mg via ORAL
  Filled 2020-06-24 (×3): qty 2

## 2020-06-24 MED ORDER — VITAMIN B-12 1000 MCG PO TABS
500.0000 ug | ORAL_TABLET | Freq: Every day | ORAL | Status: DC
  Administered 2020-06-25 – 2020-06-26 (×2): 500 ug via ORAL
  Filled 2020-06-24 (×2): qty 1

## 2020-06-24 MED ORDER — AMLODIPINE BESYLATE 5 MG PO TABS
5.0000 mg | ORAL_TABLET | Freq: Every day | ORAL | Status: DC
  Administered 2020-06-24 – 2020-06-26 (×3): 5 mg via ORAL
  Filled 2020-06-24 (×3): qty 1

## 2020-06-24 MED ORDER — WARFARIN SODIUM 4 MG PO TABS: 4.0000 mg | ORAL_TABLET | Freq: Every day | ORAL | Status: DC

## 2020-06-24 NOTE — ED Notes (Signed)
Pt cleaned and brief changed. Pt still appears to be confused at this time. Pt given a warm blanket and water to drink. Will continue to monitor pt.

## 2020-06-24 NOTE — Consult Note (Addendum)
Telepsych Consultation   Reason for Consult:  Psychiatry Reassessment Referring Physician:   Location of Patient:  [facility] Location of Provider: [place] [facility]  Patient Identification: Margaret Bishop MRN:  [id number] Principal Diagnosis: Bipolar disorder [facility]) Diagnosis:  Principal Problem:   Bipolar disorder ([place])   Total Time spent with patient: 30 minutes  Subjective:   Margaret Bishop is a 70 y.o. adult patient.  She states "I know who this is, I have already talked to you, you changed your eyes." This writer has not met with this patient previously.  HPI:  Patient is reassessed by nurse practitioner.  She is alert to self and place only.  She believes that she is at the emergency department related to COVID symptoms.  She presents with tangential conversation.  She presents with paranoia and states "this should all be confidential, I used to work at the [employer]."  She continues to discuss her childhood and the safety of her children.  She is hyperverbal during assessment.  The medical record indicates Margaret Bishop has been diagnosed with bipolar disorder.  She reports she is not followed by outpatient psychiatry but her primary care provider prescribes lithium and Lamictal.  She indicates that she is compliant with her medications typically.  She endorses average sleep and appetite but appears to be a poor historian at this time.  She denies suicidal and homicidal ideations.  She denies any history of suicide attempts.  She denies auditory and visual hallucinations.  There is no evidence that she is responding to internal stimuli.  She denies any history of alcohol or substance use.   Patient offered support and encouragement.   Past Psychiatric History: Bipolar disorder  Risk to Self:  Denies Risk to Others:  Denies Prior Inpatient Therapy:   Prior Outpatient Therapy:    Past Medical History:  Past Medical History:  Diagnosis Date   . Abdominal hernia   . Arthritis   . Bipolar disorder ([place])   . GERD (gastroesophageal reflux disease)   . History of kidney stones   . Hx of blood clots    Left leg  . Hypertension   . Shortness of breath dyspnea    with exertion    Past Surgical History:  Procedure Laterality Date  . CATARACT EXTRACTION W/PHACO Left 08/10/2019   Procedure: CATARACT EXTRACTION PHACO AND INTRAOCULAR LENS PLACEMENT LEFT EYE;  Surgeon: Baruch Goldmann, MD;  Location: [facility];  Service: Ophthalmology;  Laterality: Left;  CDE: 6.03  . CATARACT EXTRACTION W/PHACO Right 08/24/2019   Procedure: CATARACT EXTRACTION PHACO AND INTRAOCULAR LENS PLACEMENT (IOC);  Surgeon: Baruch Goldmann, MD;  Location: [facility];  Service: Ophthalmology;  Laterality: Right;  CDE: 5.18  . CHOLECYSTECTOMY    . COLONOSCOPY N/A 06/20/2015   Procedure: COLONOSCOPY;  Surgeon: Danie Binder, MD;  [facility]: [facility];  Service: Endoscopy;  Laterality: N/A;  1100  . INSERTION OF MESH N/A 11/30/2014   Procedure: INSERTION OF MESH;  Surgeon: Ralene Ok, MD;  Location: [place];  Service: General;  Laterality: N/A;  . KNEE ARTHROPLASTY Left   . UMBILICAL HERNIA REPAIR N/A 11/30/2014   Procedure: LAPAROSCOPIC UMBILICAL HERNIA REPAIR WITH MESH;  Surgeon: Ralene Ok, MD;  Location: [place];  Service: General;  Laterality: N/A;   Family History:  Family History  Problem Relation Age of Onset  . Colon cancer Neg Hx    Family Psychiatric  History: None reported Social History:  Social History   Substance and  Sexual Activity  Alcohol Use No  . Alcohol/week: 0.0 standard drinks     Social History   Substance and Sexual Activity  Drug Use No    Social History   Socioeconomic History  . Marital status: Single    Spouse name: Not on file  . Number of children: Not on file  . Years of education: Not on file  . Highest education level: Not on file  Occupational History  . Not on file  Tobacco Use  . Smoking status: Never Smoker   . Smokeless tobacco: Never Used  Substance and Sexual Activity  . Alcohol use: No    Alcohol/week: 0.0 standard drinks  . Drug use: No  . Sexual activity: Not on file  Other Topics Concern  . Not on file  Social History Narrative  . Not on file   Social Determinants of Health   Financial Resource Strain: Not on file  Food Insecurity: Not on file  Transportation Needs: Not on file  Physical Activity: Not on file  Stress: Not on file  Social Connections: Not on file   Additional Social History:    Allergies:   Allergies  Allergen Reactions  . Onion Hives and Other (See Comments)    HEARTBURN   . Shellfish Allergy Swelling  . Strawberry Extract Hives  . Tomato Hives    Labs:  Results for orders placed or performed during the hospital encounter of 06/23/20 (from the past 48 hour(s))  Resp Panel by RT-PCR (Flu A&B, Covid)     Status: Abnormal   Collection Time: 06/23/20  1:09 PM   Specimen: Nasopharyngeal(NP) swabs in vial transport medium  Result Value Ref Range   SARS Coronavirus 2 by RT PCR POSITIVE (A) NEGATIVE    Comment: RESULT CALLED TO, READ BACK BY AND VERIFIED WITH: WHITE,M [id number] BY HUFFINES,S ON 06/23/20 (NOTE) SARS-CoV-2 target nucleic acids are DETECTED.  The SARS-CoV-2 RNA is generally detectable in upper respiratory specimens during the acute phase of infection. Positive results are indicative of the presence of the identified virus, but do not rule out bacterial infection or co-infection with other pathogens not detected by the test. Clinical correlation with patient history and other diagnostic information is necessary to determine patient infection status. The expected result is Negative.  Fact Sheet for Patients: EntrepreneurPulse.com.au  Fact Sheet for Healthcare Providers: IncredibleEmployment.be  This test is not yet approved or cleared by the [place] FDA and  has been authorized for detection  and/or diagnosis of SARS-CoV-2 by FDA under an Emergency Use Authorization (EUA).  This EUA will remain in effect (meaning this test  can be used) for the duration of  the COVID-19 declaration under Section 564(b)(1) of the Act, 21 U.S.C. section 360bbb-3(b)(1), unless the authorization is terminated or revoked sooner.     Influenza A by PCR NEGATIVE NEGATIVE   Influenza B by PCR NEGATIVE NEGATIVE    Comment: (NOTE) The Xpert Xpress SARS-CoV-2/FLU/RSV plus assay is intended as an aid in the diagnosis of influenza from Nasopharyngeal swab specimens and should not be used as a sole basis for treatment. Nasal washings and aspirates are unacceptable for Xpert Xpress SARS-CoV-2/FLU/RSV testing.  Fact Sheet for Patients: EntrepreneurPulse.com.au  Fact Sheet for Healthcare Providers: IncredibleEmployment.be  This test is not yet approved or cleared by the [place] FDA and has been authorized for detection and/or diagnosis of SARS-CoV-2 by FDA under an Emergency Use Authorization (EUA). This EUA will remain in effect (meaning this test can be used)  for the duration of the COVID-19 declaration under Section 564(b)(1) of the Act, 21 U.S.C. section 360bbb-3(b)(1), unless the authorization is terminated or revoked.  Performed at [facility], [address], [place], [place] [address]   Rapid urine drug screen (hospital performed)     Status: None   Collection Time: 06/23/20  1:10 PM  Result Value Ref Range   Opiates NONE DETECTED NONE DETECTED   Cocaine NONE DETECTED NONE DETECTED   Benzodiazepines NONE DETECTED NONE DETECTED   Amphetamines NONE DETECTED NONE DETECTED   Tetrahydrocannabinol NONE DETECTED NONE DETECTED   Barbiturates NONE DETECTED NONE DETECTED    Comment: (NOTE) DRUG SCREEN FOR MEDICAL PURPOSES ONLY.  IF CONFIRMATION IS NEEDED FOR ANY PURPOSE, NOTIFY LAB WITHIN 5 DAYS.  LOWEST DETECTABLE LIMITS FOR URINE DRUG SCREEN Drug  Class                     Cutoff (ng/mL) Amphetamine and metabolites    1000 Barbiturate and metabolites    200 Benzodiazepine                 132 Tricyclics and metabolites     300 Opiates and metabolites        300 Cocaine and metabolites        300 THC                            50 Performed at [facility], [address] [place], [place] [address]   Urinalysis, Routine w reflex microscopic     Status: Abnormal   Collection Time: 06/23/20  1:10 PM  Result Value Ref Range   Color, Urine COLORLESS (A) YELLOW   APPearance CLEAR CLEAR   Specific Gravity, Urine 1.002 (L) 1.005 - 1.030   pH 7.0 5.0 - 8.0   Glucose, UA NEGATIVE NEGATIVE mg/dL   Hgb urine dipstick SMALL (A) NEGATIVE   Bilirubin Urine NEGATIVE NEGATIVE   Ketones, ur NEGATIVE NEGATIVE mg/dL   Protein, ur NEGATIVE NEGATIVE mg/dL   Nitrite NEGATIVE NEGATIVE   Leukocytes,Ua NEGATIVE NEGATIVE   RBC / HPF 0-5 0 - 5 RBC/hpf   WBC, UA 0-5 0 - 5 WBC/hpf   Bacteria, UA RARE (A) NONE SEEN   Squamous Epithelial / LPF 0-5 0 - 5   Crystals NONE SEEN (A) NEGATIVE    Comment: Performed at [facility], [address] [place], [place] [address]  Comprehensive metabolic panel     Status: Abnormal   Collection Time: 06/23/20  1:32 PM  Result Value Ref Range   Sodium 136 135 - 145 mmol/L   Potassium 3.6 3.5 - 5.1 mmol/L   Chloride 105 98 - 111 mmol/L   CO2 24 22 - 32 mmol/L   Glucose, Bld 118 (H) 70 - 99 mg/dL    Comment: Glucose reference range applies only to samples taken after fasting for at least 8 hours.   BUN 7 (L) 8 - 23 mg/dL   Creatinine, Ser 0.67 0.44 - 1.00 mg/dL   Calcium 9.2 8.9 - 10.3 mg/dL   Total Protein 7.7 6.5 - 8.1 g/dL   Albumin 4.1 3.5 - 5.0 g/dL   AST 27 15 - 41 U/L   ALT 16 0 - 44 U/L   Alkaline Phosphatase 43 38 - 126 U/L   Total Bilirubin 0.6 0.3 - 1.2 mg/dL   GFR, Estimated >60 >60 mL/min    Comment: (NOTE) Calculated using the CKD-EPI  Creatinine Equation (2021)    Anion gap 7 5 - 15    Comment:  Performed at [facility] , [address] [place], [place] [address]  CBC     Status: Abnormal   Collection Time: 06/23/20  1:32 PM  Result Value Ref Range   WBC 6.3 4.0 - 10.5 K/uL   RBC 4.03 3.87 - 5.11 MIL/uL   Hemoglobin 11.7 (L) 12.0 - 15.0 g/dL   HCT 39.7 36.0 - 46.0 %   MCV 98.5 80.0 - 100.0 fL   MCH 29.0 26.0 - 34.0 pg   MCHC 29.5 (L) 30.0 - 36.0 g/dL   RDW 12.5 11.5 - 15.5 %   Platelets 247 150 - 400 K/uL   nRBC 0.0 0.0 - 0.2 %    Comment: Performed at [facility], [address], [place], [place] [address]  Lithium level     Status: Abnormal   Collection Time: 06/23/20  1:32 PM  Result Value Ref Range   Lithium Lvl 0.43 (L) 0.60 - 1.20 mmol/L    Comment: Performed at [facility], [address], [place], [place] [address]  Troponin I (High Sensitivity)     Status: Abnormal   Collection Time: 06/23/20  1:32 PM  Result Value Ref Range   Troponin I (High Sensitivity) 112 (HH) <18 ng/L    Comment: CRITICAL RESULT CALLED TO, READ BACK BY AND VERIFIED WITH: GANTT,E ON 06/23/20 AT 1440 BY LOY,C (NOTE) Elevated high sensitivity troponin I (hsTnI) values and significant  changes across serial measurements may suggest ACS but many other  chronic and acute conditions are known to elevate hsTnI results.  Refer to the Links section for chest pain algorithms and additional  guidance. Performed at [facility], [address] [place], [place] [address]   Ethanol     Status: None   Collection Time: 06/23/20  1:32 PM  Result Value Ref Range   Alcohol, Ethyl (B) <10 <10 mg/dL    Comment: (NOTE) Lowest detectable limit for serum alcohol is 10 mg/dL.  For medical purposes only. Performed at [facility], [address], [place], [place] [address]   Protime-INR     Status: None   Collection Time: 06/23/20  1:32 PM  Result Value Ref Range   Prothrombin Time 14.9 11.4 - 15.2 seconds   INR 1.2 0.8 - 1.2    Comment: (NOTE) INR goal varies based on device and disease  states. Performed at [facility], [address], [place], [place] [address]   Troponin I (High Sensitivity)     Status: Abnormal   Collection Time: 06/23/20  3:21 PM  Result Value Ref Range   Troponin I (High Sensitivity) 88 (H) <18 ng/L    Comment: DELTA CHECK NOTED (NOTE) Elevated high sensitivity troponin I (hsTnI) values and significant  changes across serial measurements may suggest ACS but many other  chronic and acute conditions are known to elevate hsTnI results.  Refer to the Links section for chest pain algorithms and additional  guidance. Performed at [facility], [address], [place], [place] [address]     Medications:  Current Facility-Administered Medications  Medication Dose Route Frequency Provider Last Rate Last Admin  . amLODipine (NORVASC) tablet 5 mg  5 mg Oral Daily [place] Bo, MD   5 mg at 06/24/20 1207  . cephALEXin (KEFLEX) capsule 500 mg  500 mg Oral BID Daleen Bo, MD   500 mg at 06/24/20 1207  . irbesartan (AVAPRO) tablet 300 mg  300  mg Oral Daily Daleen Bo, MD   300 mg at 06/24/20 1207  . lamoTRIgine (LAMICTAL) tablet 25 mg  25 mg Oral Daily Daleen Bo, MD   25 mg at 06/24/20 1207  . lithium carbonate capsule 300 mg  300 mg Oral Daily [place]en Bo, MD   300 mg at 06/24/20 1253  . nirmatrelvir/ritonavir EUA (PAXLOVID) TABS 3 tablet  3 tablet Oral BID Daleen Bo, MD      . pantoprazole (PROTONIX) EC tablet 40 mg  40 mg Oral Daily [place]en Bo, MD   40 mg at 06/24/20 1208  . vitamin B-12 (CYANOCOBALAMIN) tablet 500 mcg  500 mcg Oral Daily Daleen Bo, MD      . warfarin (COUMADIN) tablet 5 mg  5 mg Oral ONCE-1600 Daleen Bo, MD      . Warfarin - Pharmacist Dosing Inpatient   Does not apply q1600 Daleen Bo, MD       Current Outpatient Medications  Medication Sig Dispense Refill  . acetaminophen (TYLENOL) 500 MG tablet Take 500 mg by mouth as needed for mild pain.    Marland Kitchen amLODipine-olmesartan (AZOR) 5-40 MG per tablet  Take 1 tablet by mouth daily.    . cephALEXin (KEFLEX) 500 MG capsule Take 1 capsule (500 mg total) by mouth 2 (two) times daily. 14 capsule 0  . lamoTRIgine (LAMICTAL) 25 MG tablet Take 25 mg by mouth daily.    Marland Kitchen lithium 300 MG tablet Take 300 mg by mouth daily.     Marland Kitchen omeprazole (PRILOSEC) 20 MG capsule Take 20 mg by mouth daily.    . vitamin B-12 (CYANOCOBALAMIN) 500 MCG tablet Take 500 mcg by mouth daily.    Marland Kitchen warfarin (COUMADIN) 4 MG tablet Take 4 mg by mouth daily.      Musculoskeletal: Strength & Muscle Tone: within normal limits Gait & Station: unable to assess Patient leans: N/A  Psychiatric Specialty Exam: Physical Exam Vitals and nursing note reviewed.  Constitutional:      Appearance: She is well-developed. She is obese.  HENT:     Head: Normocephalic and atraumatic.     Nose: Nose normal.  Cardiovascular:     Rate and Rhythm: Normal rate.  Pulmonary:     Effort: Pulmonary effort is normal.  Musculoskeletal:     Cervical back: Normal range of motion.  Neurological:     Mental Status: She is alert.     Comments: Oriented to self and place only.  Psychiatric:        Mood and Affect: Mood is elated. Affect is labile.        Speech: Speech is tangential.        Behavior: Behavior normal. Behavior is cooperative.        Thought Content: Thought content is paranoid and delusional.     Review of Systems  Constitutional: Negative.   HENT: Negative.   Eyes: Negative.   Respiratory: Negative.   Cardiovascular: Negative.   Gastrointestinal: Negative.   Genitourinary: Negative.   Musculoskeletal: Negative.   Skin: Negative.   Neurological: Negative.   Psychiatric/Behavioral: Positive for confusion.    Blood pressure 107/83, pulse 86, temperature 99.2 F (37.3 C), temperature source Oral, resp. rate 18, height 5' 7.5" (1.715 m), weight 90.7 kg, SpO2 100 %.Body mass index is 30.86 kg/m.  General Appearance: Casual and Fairly Groomed  Eye Contact:  Good  Speech:   Normal Rate  Volume:  Normal  Mood:  Anxious  Affect:  Labile  Thought Process:  Descriptions  of Associations: Tangential  Orientation:  Other:  Person  Thought Content:  Paranoid Ideation and Tangential  Suicidal Thoughts:  No  Homicidal Thoughts:  No  Memory:  Immediate;   Fair Recent;   Fair Remote;   Fair  Judgement:  Impaired  Insight:  Shallow  Psychomotor Activity:  Normal  Concentration:  Concentration: Fair and Attention Span: Fair  Recall:  [employer] of Knowledge:  Fair  Language:  Good  Akathisia:  No  Handed:  Right  AIMS (if indicated):     Assets:  Communication Skills Desire for Improvement Financial Resources/Insurance Housing Intimacy Leisure Time Resilience Social Support  ADL's:  Intact  Cognition:  WNL  Sleep:        Treatment Plan Summary: Plan Patient reviewed with Dr. Serafina Mitchell.  Inpatient geriatric psychiatric treatment recommended. Patient has been restarted on home medications including: -Lithium 300 mg daily -Lamotrigine 25 mg daily   Disposition: Recommend psychiatric Inpatient admission when medically cleared. Supportive therapy provided about ongoing stressors.  This service was provided via telemedicine using a 2-way, interactive audio and video technology.  Names of all persons participating in this telemedicine service and their role in this encounter. Name: Frederich Chick Role: Patient  Name: Beatriz Stallion Role: FNP  Name: Dr. Serafina Mitchell Role: Psychiatry    Lucky Rathke, FNP 06/24/2020 2:09 PM

## 2020-06-24 NOTE — ED Notes (Signed)
Patient's son called.  Patient was sleeping and did not want to be bothered.    Advised son and requested that he call back in approximately 1 hour  Son stated this was acceptable.

## 2020-06-24 NOTE — ED Provider Notes (Addendum)
Emergency Medicine Observation Re-evaluation Note  Margaret Bishop is a 70 y.o. adult, seen on rounds today.  Pt initially presented to the ED for complaints of Altered Mental Status Currently, the patient is resting comfortably in bed.  Vital signs have been normal and stable today.Marland Kitchen  Physical Exam  BP 107/83 (BP Location: Left Arm)   Pulse 86   Temp 99.2 F (37.3 C) (Oral)   Resp 18   Ht 5' 7.5" (1.715 m)   Wt 90.7 kg   SpO2 100%   BMI 30.86 kg/m  Physical Exam General: Resting comfortably Cardiac: Normal heart rate Lungs: Normal respiratory rate Psych: Not currently responding to internal stimuli  ED Course / MDM  EKG:EKG Interpretation  Date/Time:  Thursday Jun 23 2020 12:56:04 EDT Ventricular Rate:  84 PR Interval:  172 QRS Duration: 106 QT Interval:  445 QTC Calculation: 527 R Axis:   20 Text Interpretation: Sinus rhythm Nonspecific T wave abnormality Prolonged QT interval Confirmed by Lajean Saver [phone]) on 06/23/2020 12:58:57 PM   I have reviewed the labs performed to date as well as medications administered while in observation.  Recent changes in the last 24 hours include being evaluated for placement by psychiatry.  Unfortunately she is COVID-positive.  Plan  Current plan is for psychiatric observation. Patient is not under full IVC at this time.   Chart review indicates COVID-positive, unclear onset of symptoms.  She is at risk for progression of disease due to history of cancer, and psychiatric illness.  She apparently also has low risk for sleep apnea.  She has been treated for blood clots.  Due to these multiple comorbidities and her age I believe that she needs antiviral treatment.  Hospital pharmacist consulted to help initiate appropriate treatment for COVID.  Psychiatry has seen the patient and currently is observing, for progression and stability, prior to decision for placement.  After consultation with the pharmacy department, the patient was started  on pecks fluid, as an antiviral (Paxlovid) to prevent progression of COVID illness.  Also will start home meds which were not started yesterday.  Patient's INR will have to be watched closely, since it can be altered by Paxlovid.  Patient will require 5-day treatment of the antiviral medication.   [place]en Bo, MD 06/24/20 1119  According to the pharmacist, the patient has been dispensed a full treatment course of Paxlovid.  Therefore it will need to stay with her for administration over the next 4 days.    Daleen Bo, MD 06/24/20 [phone]

## 2020-06-24 NOTE — ED Notes (Signed)
Pt in bed, pt helped into hospital red scrubs, pt denies si or hi, tele sitting in room, pt has flight of ideas and pressured speech.

## 2020-06-24 NOTE — BH Assessment (Addendum)
Disposition:   Patient recommended for inpatient [facility] treatment by Letitia Libra, NP. Referred the following hospital for consideration of bed placement.   [facility] Details  [facility] Details  [facility] Details  [facility] Details [facility] [place] [facility] Details [facility] Details [place] Details [facility] Details [facility] Details [facility] Details [place] Details [facility] Details [facility] Details [facility] Details [facility] Details [facility] Details [place] Details [place] Details

## 2020-06-24 NOTE — ED Notes (Signed)
Update given to son, Delfina Redwood

## 2020-06-24 NOTE — Progress Notes (Signed)
MEDICATION RELATED CONSULT NOTE - INITIAL   Pharmacy Consult for COVID tx and Coumadin dosing Indication: + SARS-2 and history of DVT  Allergies  Allergen Reactions  . Onion Hives and Other (See Comments)    HEARTBURN   . Shellfish Allergy Swelling  . Strawberry Extract Hives  . Tomato Hives    Patient Measurements: Height: 5' 7.5" (171.5 cm) Weight: 90.7 kg (200 lb) IBW/kg (Calculated) : 62.75  Vital Signs: Temp: 99.2 F (37.3 C) (05/27 0916) Temp Source: Oral (05/27 0916) BP: 107/83 (05/27 0916) Pulse Rate: 86 (05/27 0916) Intake/Output from previous day: 05/26 0701 - 05/27 0700 In: -  Out: 600 [Urine:600] Intake/Output from this shift: No intake/output data recorded.  Labs: Recent Labs    06/23/20 1332  WBC 6.3  HGB 11.7*  HCT 39.7  PLT 247  CREATININE 0.67  ALBUMIN 4.1  PROT 7.7  AST 27  ALT 16  ALKPHOS 43  BILITOT 0.6    Estimated Creatinine Clearance (by C-G formula based on SCr of 0.67 mg/dL) Female: 75.3 mL/min Female: 91.9 mL/min   Microbiology: Recent Results (from the past 720 hour(s))  Resp Panel by RT-PCR (Flu A&B, Covid)     Status: Abnormal   Collection Time: 06/23/20  1:09 PM   Specimen: Nasopharyngeal(NP) swabs in vial transport medium  Result Value Ref Range Status   SARS Coronavirus 2 by RT PCR POSITIVE (A) NEGATIVE Final    Comment: RESULT CALLED TO, READ BACK BY AND VERIFIED WITH: WHITE,M [id number] BY HUFFINES,S ON 06/23/20 (NOTE) SARS-CoV-2 target nucleic acids are DETECTED.  The SARS-CoV-2 RNA is generally detectable in upper respiratory specimens during the acute phase of infection. Positive results are indicative of the presence of the identified virus, but do not rule out bacterial infection or co-infection with other pathogens not detected by the test. Clinical correlation with patient history and other diagnostic information is necessary to determine patient infection status. The expected result is Negative.  Fact Sheet  for Patients: EntrepreneurPulse.com.au  Fact Sheet for Healthcare Providers: IncredibleEmployment.be  This test is not yet approved or cleared by the [place] FDA and  has been authorized for detection and/or diagnosis of SARS-CoV-2 by FDA under an Emergency Use Authorization (EUA).  This EUA will remain in effect (meaning this test  can be used) for the duration of  the COVID-19 declaration under Section 564(b)(1) of the Act, 21 U.S.C. section 360bbb-3(b)(1), unless the authorization is terminated or revoked sooner.     Influenza A by PCR NEGATIVE NEGATIVE Final   Influenza B by PCR NEGATIVE NEGATIVE Final    Comment: (NOTE) The Xpert Xpress SARS-CoV-2/FLU/RSV plus assay is intended as an aid in the diagnosis of influenza from Nasopharyngeal swab specimens and should not be used as a sole basis for treatment. Nasal washings and aspirates are unacceptable for Xpert Xpress SARS-CoV-2/FLU/RSV testing.  Fact Sheet for Patients: EntrepreneurPulse.com.au  Fact Sheet for Healthcare Providers: IncredibleEmployment.be  This test is not yet approved or cleared by the [place] FDA and has been authorized for detection and/or diagnosis of SARS-CoV-2 by FDA under an Emergency Use Authorization (EUA). This EUA will remain in effect (meaning this test can be used) for the duration of the COVID-19 declaration under Section 564(b)(1) of the Act, 21 U.S.C. section 360bbb-3(b)(1), unless the authorization is terminated or revoked.  Performed at [facility], [address] [place], [place] [address]     Medical History: Past Medical History:  Diagnosis Date  . Abdominal hernia   .  Arthritis   . Bipolar disorder ([place])   . GERD (gastroesophageal reflux disease)   . History of kidney stones   . Hx of blood clots    Left leg  . Hypertension   . Shortness of breath dyspnea    with exertion     Medications:  See med rec  Assessment: Patient with hx bipolar presents w 'acting/talking odd' for past few days.  Incidentally, she is COVID-positive, unclear onset of symptoms.  MD feels she is at risk for progression of disease due to history of cancer, and psychiatric illness and also has been treated for blood clots. No SOB or other COVID sxs but due to these multiple comorbidities MD want COVID treatment. After consultation with MD, will initiate the patient on Paxlovid to prevent progression of COVID illness.  Also will start home meds which were not started yesterday.  Patient's INR will have to be watched closely, since it can be altered by Paxlovid.  Patient will require 5-day treatment of the antiviral medication. Will monitor and dose Coumadin. INR on admission is 1.2 which is subtherapeutic. Expect possible further decrease with concurrent use of paxlovid.   Usual Coumadin home dose is 4mg  daily Goal of Therapy:  INR 2-3  Plan:  Coumadin 5mg  po x 1 today PT-INR daily Paxlovid 3 tabs BID for 5 days Monitor for S/S of bleeding  Isac Sarna, BS [place], BCPS Clinical Pharmacist Pager [phone] 06/24/2020,12:25 PM

## 2020-06-24 NOTE — Consult Note (Signed)
Spoke with patient's daughter, Saria Haran, with patient's verbal consent to discuss treatment plan. Dawn reports patient has recently switched doctors related to her insurance coverage.  Patient's daughter states her mother "has not been seen in a while by doctor."  Patient's daughter is uncertain as to whether patient had access to medications recently. Daughter reports patient has a history of similar episodes including paranoia and admission to [facility] geriatric psychiatry unit. Discussed treatment plan to include initiation of home medications and inpatient psychiatric treatment, if continued criteria met, wants COVID quarantine requirement completed. Dawn verbalizes understanding of treatment plan and denies further questions at this time.

## 2020-06-25 LAB — PROTIME-INR
INR: 1.1 (ref 0.8–1.2)
Prothrombin Time: 14.6 seconds (ref 11.4–15.2)

## 2020-06-25 MED ORDER — WARFARIN SODIUM 7.5 MG PO TABS
7.5000 mg | ORAL_TABLET | Freq: Once | ORAL | Status: AC
  Administered 2020-06-25: 7.5 mg via ORAL
  Filled 2020-06-25: qty 1

## 2020-06-25 NOTE — Progress Notes (Signed)
ANTICOAGULATION CONSULT NOTE -   Pharmacy Consult for Coumadin Indication: Previous DVTS  Allergies  Allergen Reactions  . Onion Hives and Other (See Comments)    HEARTBURN   . Shellfish Allergy Swelling  . Strawberry Extract Hives  . Tomato Hives    Patient Measurements: Height: 5' 7.5" (171.5 cm) Weight: 90.7 kg (200 lb) IBW/kg (Calculated) : 62.75  Vital Signs: Temp: 99 F (37.2 C) (05/28 0651) Temp Source: Oral (05/28 0651) BP: 131/68 (05/28 0651) Pulse Rate: 58 (05/28 0651)  Labs: Recent Labs    06/23/20 1332 06/23/20 1521 06/25/20 0532  HGB 11.7*  --   --   HCT 39.7  --   --   PLT 247  --   --   LABPROT 14.9  --  14.6  INR 1.2  --  1.1  CREATININE 0.67  --   --   TROPONINIHS 112* 88*  --     Estimated Creatinine Clearance (by C-G formula based on SCr of 0.67 mg/dL) Female: 75.3 mL/min Female: 91.9 mL/min   Medical History: Past Medical History:  Diagnosis Date  . Abdominal hernia   . Arthritis   . Bipolar disorder ([place])   . GERD (gastroesophageal reflux disease)   . History of kidney stones   . Hx of blood clots    Left leg  . Hypertension   . Shortness of breath dyspnea    with exertion    Medications:  See med rec  Assessment: Patient with hx bipolar presents w 'acting/talking odd' for past few days.  Incidentally, she is COVID-positive, unclear onset of symptoms. MD feels she is at risk for progression of disease due to history of cancer, and psychiatric illness and also has been treated for blood clots. No SOB or other COVID sxs but due to these multiple comorbidities MD want COVID treatment. After consultation with MD, will initiate the patient on Paxlovidto prevent progression of COVID illness. Also will start home meds which were not started yesterday. Patient's INR will have to be watched closely, since it can be altered by Paxlovid.Patient will require 5-day treatment of the antiviral medication. Will monitor and dose Coumadin. INR  on admission is 1.2 which is subtherapeutic. Expect possible further decrease with concurrent use of paxlovid.  Usual Coumadin home dose is 4mg  daily INR 1.1, subtherapeutic, will give booster dose today  Goal of Therapy:  INR 2-3 Monitor platelets by anticoagulation protocol: Yes   Plan:  Coumadin 7.5mg  po x 1 today PT-INR daily Monitor for S/S of bleeding  Isac Sarna, BS [place], BCPS Clinical Pharmacist Pager [phone] 06/25/2020,9:03 AM

## 2020-06-25 NOTE — ED Provider Notes (Signed)
Emergency Medicine Observation Re-evaluation Note  Margaret Bishop is a 70 y.o. adult, seen on rounds today.  Pt initially presented to the ED for complaints of Altered Mental Status Currently, the patient is awaiting placement.  Physical Exam  BP 131/68 (BP Location: Right Arm)   Pulse (!) 58   Temp 99 F (37.2 C) (Oral)   Resp 18   Ht 5' 7.5" (1.715 m)   Wt 90.7 kg   SpO2 98%   BMI 30.86 kg/m  Physical Exam General: Calm and cooperative.  Cardiac: Well perfused.  Lungs: Even, unlabored respirations   ED Course / MDM  EKG:EKG Interpretation  Date/Time:  Thursday Jun 23 2020 15:34:24 EDT Ventricular Rate:  97 PR Interval:  178 QRS Duration: 98 QT Interval:  425 QTC Calculation: 540 R Axis:   32 Text Interpretation: Sinus rhythm Abnormal R-wave progression, early transition Abnormal T, consider ischemia, diffuse leads Prolonged QT interval When compared with ECG of EARLIER SAME DATE No significant change was found Confirmed by Delora Fuel ([id number]) on 06/25/2020 3:53:17 AM   I have reviewed the labs performed to date as well as medications administered while in observation.  Recent changes in the last 24 hours include referrals sent out to multiple facilities. Patient undergoing treatment for COVID.   Plan  Current plan is for [facility] Placement.  Patient is not under full IVC at this time.   Margaret Fast, MD 06/26/20 [phone]

## 2020-06-25 NOTE — ED Notes (Signed)
Pt full linen change, new scrubs provided. purewick replaced. Pt alert and oriented x 4. Water and snacks provided as requested. Tele-sitter remains at bedside.

## 2020-06-26 DIAGNOSIS — F3112 Bipolar disorder, current episode manic without psychotic features, moderate: Secondary | ICD-10-CM

## 2020-06-26 LAB — PROTIME-INR
INR: 1.1 (ref 0.8–1.2)
Prothrombin Time: 14.5 seconds (ref 11.4–15.2)

## 2020-06-26 MED ORDER — WARFARIN SODIUM 7.5 MG PO TABS: 7.5000 mg | ORAL_TABLET | Freq: Once | ORAL | Status: DC

## 2020-06-26 NOTE — ED Notes (Signed)
Pt. Refused breakfast

## 2020-06-26 NOTE — Discharge Instructions (Signed)
You are being released from the emergency department today.  Please follow with your primary care doctor.  We are sending you home with COVID pills to take for the next 2 days.  Please complete this course even though you are feeling better.  If you develop new or suddenly worsening symptoms please return to the emergency department.

## 2020-06-26 NOTE — ED Notes (Signed)
90% of meal tray consumed

## 2020-06-26 NOTE — ED Provider Notes (Addendum)
Emergency Medicine Observation Re-evaluation Note  Margaret Bishop is a 70 y.o. adult, seen on rounds today.  Pt initially presented to the ED for complaints of Altered Mental Status Currently, the patient is [place] placement.  Physical Exam  BP 126/74 (BP Location: Right Arm)   Pulse 85   Temp 98.4 F (36.9 C) (Oral)   Resp 18   Ht 5' 7.5" (1.715 m)   Wt 90.7 kg   SpO2 97%   BMI 30.86 kg/m  Physical Exam General: Calm and cooperative  Cardiac: well perfused.  Lungs: even, unlabored respirations  Psych: Calm  ED Course / MDM  EKG:EKG Interpretation  Date/Time:  Thursday Jun 23 2020 15:34:24 EDT Ventricular Rate:  97 PR Interval:  178 QRS Duration: 98 QT Interval:  425 QTC Calculation: 540 R Axis:   32 Text Interpretation: Sinus rhythm Abnormal R-wave progression, early transition Abnormal T, consider ischemia, diffuse leads Prolonged QT interval When compared with ECG of EARLIER SAME DATE No significant change was found Confirmed by Delora Fuel ([id number]) on 06/25/2020 3:53:17 AM   I have reviewed the labs performed to date as well as medications administered while in observation.  Recent changes in the last 24 hours: N/A.  Plan  Current plan is for [facility] psych placement.  01:25 PM  Patient reevaluated by behavioral health today.  She is much more clear.  She is awake and alert.  They are clearing her from a psych perspective.  I reassessed the patient and agree that she seems much better.  She has her Paxlovid medication here and this will be sent home with her to complete the course at home.  She is not hypoxemic.  She is having no medical complaints.  She is excited about the possibility of going home. Ready for d/c once family arrives.    [name], MD 06/26/20 [id number]    [name], MD 06/26/20 [phone]

## 2020-06-26 NOTE — ED Notes (Addendum)
Pt reports she is resting well, purewick functioning without complication, brief and linens are dry. Pt reports she ate all of the food I provided. Pt requested blue bag to bedside table, denies pain, denies other needs at this time. Tele-sitter remains at bedside. Will continue to monitor.

## 2020-06-26 NOTE — ED Notes (Signed)
Call to [facility], son, he reports he will be picking pt up within next hour for d/c

## 2020-06-26 NOTE — Consult Note (Signed)
Telepsych Consultation   Reason for Consult:  Psychiatry Reassessment Referring Physician:  EDP Location of Patient:  [facility] Location of Provider: Other: home virtual office  Patient Identification: Margaret Bishop MRN:  [id number] Principal Diagnosis: Bipolar disorder [facility]) Diagnosis:  Principal Problem:   Bipolar disorder ([place])   Total Time spent with patient: 30 minutes  Subjective:   Margaret Bishop is a 70 y.o. adult patient admitted to [facility] emergency department for acute psychosis in the setting of non-adherence to psychotropic medications. She was also found to be covid positive, started on antivirals and pharmacy is monitoring her INR levels.   When asked about her whereabout today, patient states, "I think I am at [facility] in [place], [place]. They told me I have the virus."  Patient is seen via telepsych by this provider; chart reviewed and consulted with Dr. Dwyane Dee on 06/26/20.  On evaluation Margaret Bishop is seen sitting up in bed, watching the television.  She says hello when greeted by this Probation officer, states her name, date of birth and agrees to participate in the assessment.  She reports she was admitted to the hospital 2 days after her mother passed away.  At that time she reports being tasked with making funeral arrangements.  States the increased responsibilities led to her taking her medications late or not taking them at all. She acknowledges she was not feeling well mentally or physically when she was admitted.       On admission, she was acutely psychotic, and had visual hallucinations.  Per labs, she had a UTI and was also positive for covid, for which she received antibiotics and antivirals respectively.  Her UDS was negative. From a psychiatric standing, she was restarted on her medications, Lithium 300mg  po daily; lamotrigine 25mg  po daily;  Has been medication compliant; Her sleep and appetite have booth remained good.  She   demonstrates symptomatic improvement since admission and her mood is stabilized.  She is future oriented relates he plan to go home with her sister. Per nursing notes, she has been cooperative and appropriate.     The patient denies suicidal and homicidal ideations. She denies auditory and visual hallucinations.  There is no evidence that she is responding to internal stimuli.  Past Psychiatric History: Bipolar disorder  Risk to Self:  no Risk to Others:  no Prior Inpatient Therapy:   Prior Outpatient Therapy:    Past Medical History:  Past Medical History:  Diagnosis Date  . Abdominal hernia   . Arthritis   . Bipolar disorder ([place])   . GERD (gastroesophageal reflux disease)   . History of kidney stones   . Hx of blood clots    Left leg  . Hypertension   . Shortness of breath dyspnea    with exertion    Past Surgical History:  Procedure Laterality Date  . CATARACT EXTRACTION W/PHACO Left 08/10/2019   Procedure: CATARACT EXTRACTION PHACO AND INTRAOCULAR LENS PLACEMENT LEFT EYE;  Surgeon: Baruch Goldmann, MD;  [facility];  Service: Ophthalmology;  Laterality: Left;  CDE: 6.03  . CATARACT EXTRACTION W/PHACO Right 08/24/2019   Procedure: CATARACT EXTRACTION PHACO AND INTRAOCULAR LENS PLACEMENT (IOC);  Surgeon: Baruch Goldmann, MD;  Location: [facility];  Service: Ophthalmology;  Laterality: Right;  CDE: 5.18  . CHOLECYSTECTOMY    . COLONOSCOPY N/A 06/20/2015   Procedure: COLONOSCOPY;  Surgeon: Danie Binder, MD;  [facility];  Service: Endoscopy;  Laterality: N/A;  1100  .  INSERTION OF MESH N/A 11/30/2014   Procedure: INSERTION OF MESH;  Surgeon: Ralene Ok, MD;  Location: [place];  Service: General;  Laterality: N/A;  . KNEE ARTHROPLASTY Left   . UMBILICAL HERNIA REPAIR N/A 11/30/2014   Procedure: LAPAROSCOPIC UMBILICAL HERNIA REPAIR WITH MESH;  Surgeon: Ralene Ok, MD;  Location: [place];  Service: General;  Laterality: N/A;   Family History:  Family History   Problem Relation Age of Onset  . Colon cancer Neg Hx    Family Psychiatric  History: None reported Social History:  Social History   Substance and Sexual Activity  Alcohol Use No  . Alcohol/week: 0.0 standard drinks     Social History   Substance and Sexual Activity  Drug Use No    Social History   Socioeconomic History  . Marital status: Single    Spouse name: Not on file  . Number of children: Not on file  . Years of education: Not on file  . Highest education level: Not on file  Occupational History  . Not on file  Tobacco Use  . Smoking status: Never Smoker  . Smokeless tobacco: Never Used  Substance and Sexual Activity  . Alcohol use: No    Alcohol/week: 0.0 standard drinks  . Drug use: No  . Sexual activity: Not on file  Other Topics Concern  . Not on file  Social History Narrative  . Not on file   Social Determinants of Health   Financial Resource Strain: Not on file  Food Insecurity: Not on file  Transportation Needs: Not on file  Physical Activity: Not on file  Stress: Not on file  Social Connections: Not on file   Additional Social History:    Allergies:   Allergies  Allergen Reactions  . Onion Hives and Other (See Comments)    HEARTBURN   . Shellfish Allergy Swelling  . Strawberry Extract Hives  . Tomato Hives    Labs:  Results for orders placed or performed during the hospital encounter of 06/23/20 (from the past 48 hour(s))  Protime-INR     Status: None   Collection Time: 06/25/20  5:32 AM  Result Value Ref Range   Prothrombin Time 14.6 11.4 - 15.2 seconds   INR 1.1 0.8 - 1.2    Comment: (NOTE) INR goal varies based on device and disease states. Performed at [facility], [address] [place], [place] [address]   Protime-INR     Status: None   Collection Time: 06/26/20  6:49 AM  Result Value Ref Range   Prothrombin Time 14.5 11.4 - 15.2 seconds   INR 1.1 0.8 - 1.2    Comment: (NOTE) INR goal varies based on device and  disease states. Performed at [facility], [address] [place], [place] [address]     Medications:  Current Facility-Administered Medications  Medication Dose Route Frequency Provider Last Rate Last Admin  . amLODipine (NORVASC) tablet 5 mg  5 mg Oral Daily Daleen Bo, MD   5 mg at 06/26/20 0925  . cephALEXin (KEFLEX) capsule 500 mg  500 mg Oral BID Daleen Bo, MD   500 mg at 06/26/20 0925  . irbesartan (AVAPRO) tablet 300 mg  300 mg Oral Daily Daleen Bo, MD   300 mg at 06/26/20 0936  . lamoTRIgine (LAMICTAL) tablet 25 mg  25 mg Oral Daily Daleen Bo, MD   25 mg at 06/26/20 0925  . lithium carbonate capsule 300 mg  300 mg Oral Daily Daleen Bo, MD  300 mg at 06/26/20 0924  . nirmatrelvir/ritonavir EUA (PAXLOVID) TABS 3 tablet  3 tablet Oral BID Daleen Bo, MD   3 tablet at 06/26/20 [phone]  . pantoprazole (PROTONIX) EC tablet 40 mg  40 mg Oral Daily Daleen Bo, MD   40 mg at 06/26/20 0924  . vitamin B-12 (CYANOCOBALAMIN) tablet 500 mcg  500 mcg Oral Daily Daleen Bo, MD   500 mcg at 06/26/20 0925  . warfarin (COUMADIN) tablet 7.5 mg  7.5 mg Oral ONCE-1600 Long, [employer], MD      . Warfarin - Pharmacist Dosing Inpatient   Does not apply [id number] Daleen Bo, MD   Given at 06/25/20 1705   Current Outpatient Medications  Medication Sig Dispense Refill  . acetaminophen (TYLENOL) 500 MG tablet Take 500 mg by mouth as needed for mild pain.    Marland Kitchen amLODipine-olmesartan (AZOR) 5-40 MG per tablet Take 1 tablet by mouth daily.    . cephALEXin (KEFLEX) 500 MG capsule Take 1 capsule (500 mg total) by mouth 2 (two) times daily. 14 capsule 0  . lamoTRIgine (LAMICTAL) 25 MG tablet Take 25 mg by mouth daily.    Marland Kitchen lithium 300 MG tablet Take 300 mg by mouth daily.     Marland Kitchen omeprazole (PRILOSEC) 20 MG capsule Take 20 mg by mouth daily.    . vitamin B-12 (CYANOCOBALAMIN) 500 MCG tablet Take 500 mcg by mouth daily.    Marland Kitchen warfarin (COUMADIN) 4 MG tablet Take 4 mg by mouth daily.       Musculoskeletal: Strength & Muscle Tone: within normal limits Gait & Station: unable to assess Patient leans: N/A  Psychiatric Specialty Exam: Physical Exam Vitals and nursing note reviewed.  Constitutional:      Appearance: She is well-developed. She is obese.  HENT:     Head: Normocephalic and atraumatic.     Nose: Nose normal.  Cardiovascular:     Rate and Rhythm: Normal rate.  Pulmonary:     Effort: Pulmonary effort is normal.  Musculoskeletal:     Cervical back: Normal range of motion.  Neurological:     Mental Status: She is alert and oriented to person, place, and time.  Psychiatric:        Attention and Perception: Attention and perception normal.        Mood and Affect: Mood and affect normal.        Speech: Speech normal.        Behavior: Behavior normal. Behavior is cooperative.        Thought Content: Thought content normal.        Judgment: Judgment normal.     Review of Systems  Constitutional: Negative.   HENT: Negative.   Eyes: Negative.   Respiratory: Negative.   Cardiovascular: Negative.   Gastrointestinal: Negative.   Endocrine: Negative.   Genitourinary: Negative.   Musculoskeletal: Negative.   Skin: Negative.   Allergic/Immunologic: Negative.   Neurological: Negative.   Psychiatric/Behavioral: Negative for confusion, dysphoric mood, hallucinations, sleep disturbance and suicidal ideas.    Blood pressure 126/74, pulse 85, temperature 98.4 F (36.9 C), temperature source Oral, resp. rate 18, height 5' 7.5" (1.715 m), weight 90.7 kg, SpO2 97 %.Body mass index is 30.86 kg/m.  General Appearance: Casual and Neat  Eye Contact:  Good  Speech:  Clear and Coherent and Normal Rate  Volume:  Normal  Mood:  Euthymic  Affect:  Appropriate and Congruent  Thought Process:  Coherent, Goal Directed and Descriptions of Associations: Intact  Orientation:  Full (Time, Place, and Person)  Thought Content:  Logical improved since admission and restarting  meds.  Suicidal Thoughts:  No  Homicidal Thoughts:  No  Memory:  Immediate;   Good Recent;   Good Remote;   Good  Judgement:  Good  Insight:  Fair but improving  Psychomotor Activity:  Normal  Concentration:  Concentration: Good and Attention Span: Good  Recall:  Good  Fund of Knowledge:  Good  Language:  Good  Akathisia:  No  Handed:  Right  AIMS (if indicated):     Assets:  Communication Skills Desire for Improvement Financial Resources/Insurance Housing Intimacy Leisure Time Resilience Social Support  ADL's:  Intact  Cognition:  WNL  Sleep:   >8h   Treatment Plan Summary:  Plan- As per above assessment, there are no current grounds for involuntary commitment at this time.?  Patient is not currently interested in inpatient services, but expresses agreement to continue outpatient treatment - we have reviewed importance of medication compliance. She will follow-up with her outpatient mental health provider within a one week of discharge. ?She lives with her sister and her family have been notified of pending discharge.   Disposition: No evidence of imminent risk to self or others at present.   Patient does not meet criteria for psychiatric inpatient admission. Supportive therapy provided about ongoing stressors. Discussed crisis plan, support from social network, calling 911, coming to the Emergency Department, and calling Suicide Hotline.   Spoke with Dr. Nanda Quinton, [place]; Whitney Post, RN informed of above recommendation and disposition.  Message sent to [employer], SW via chat.    This service was provided via telemedicine using a 2-way, interactive audio and video technology.  Names of all persons participating in this telemedicine service and their role in this encounter. Name: Cacie Gaskins Role: Patient  Name: Merlyn Lot Role: Harris, NP 06/26/2020 12:50 PM

## 2020-06-26 NOTE — ED Notes (Signed)
Pt had full linen change, new brief and new purewick applied. Pt was hesitant to allow a second nurse to help change her because she did not recognize her. Pt expressing rambling and irrelevant speech despite being able to answer orientation questions. After education and encouragement pt took meds per MAR. She initially insisted a doctor be at bedside before taking medications. Pt provided bagged lunch. VS updated and stable. Tele-sitter remains at bedside. Will continue to monitor.

## 2020-06-26 NOTE — Progress Notes (Signed)
ANTICOAGULATION CONSULT NOTE -   Pharmacy Consult for Coumadin Indication: Previous DVTS  Allergies  Allergen Reactions  . Onion Hives and Other (See Comments)    HEARTBURN   . Shellfish Allergy Swelling  . Strawberry Extract Hives  . Tomato Hives    Patient Measurements: Height: 5' 7.5" (171.5 cm) Weight: 90.7 kg (200 lb) IBW/kg (Calculated) : 62.75  Vital Signs: Temp: 98.4 F (36.9 C) (05/29 0636) Temp Source: Oral (05/29 0636) BP: 126/74 (05/29 0636) Pulse Rate: 85 (05/29 0636)  Labs: Recent Labs    06/23/20 1332 06/23/20 1521 06/25/20 0532 06/26/20 0649  HGB 11.7*  --   --   --   HCT 39.7  --   --   --   PLT 247  --   --   --   LABPROT 14.9  --  14.6 14.5  INR 1.2  --  1.1 1.1  CREATININE 0.67  --   --   --   TROPONINIHS 112* 88*  --   --     Estimated Creatinine Clearance (by C-G formula based on SCr of 0.67 mg/dL) Female: 75.3 mL/min Female: 91.9 mL/min   Medical History: Past Medical History:  Diagnosis Date  . Abdominal hernia   . Arthritis   . Bipolar disorder ([place])   . GERD (gastroesophageal reflux disease)   . History of kidney stones   . Hx of blood clots    Left leg  . Hypertension   . Shortness of breath dyspnea    with exertion    Medications:  See med rec  Assessment: Patient with hx bipolar presents w 'acting/talking odd' for past few days.  Incidentally, she is COVID-positive, unclear onset of symptoms. MD feels she is at risk for progression of disease due to history of cancer, and psychiatric illness and also has been treated for blood clots. No SOB or other COVID sxs but due to these multiple comorbidities MD want COVID treatment. After consultation with MD, will initiate the patient on Paxlovidto prevent progression of COVID illness. Also will start home meds which were not started yesterday. Patient's INR will have to be watched closely, since it can be altered by Paxlovid.Patient will require 5-day treatment of the  antiviral medication. Will monitor and dose Coumadin. INR on admission is 1.2 which is subtherapeutic. Expect possible further decrease with concurrent use of paxlovid.  Usual Coumadin home dose is 4mg  daily INR 1.1, still subtherapeutic, will give another booster dose today  Goal of Therapy:  INR 2-3 Monitor platelets by anticoagulation protocol: Yes   Plan:  Coumadin 7.5mg  po x 1 today PT-INR daily Monitor for S/S of bleeding  Isac Sarna, BS Vena [place], BCPS Clinical Pharmacist Pager [phone] 06/26/2020,8:36 AM

## 2021-06-13 ENCOUNTER — Other Ambulatory Visit: Payer: Self-pay | Admitting: Nurse Practitioner

## 2021-06-13 ENCOUNTER — Other Ambulatory Visit (HOSPITAL_COMMUNITY): Payer: Self-pay | Admitting: Nurse Practitioner

## 2021-06-13 DIAGNOSIS — R229 Localized swelling, mass and lump, unspecified: Secondary | ICD-10-CM

## 2021-06-27 ENCOUNTER — Ambulatory Visit (HOSPITAL_COMMUNITY)
Admission: RE | Admit: 2021-06-27 | Discharge: 2021-06-27 | Disposition: A | Discharge status: Home / Self Care | Payer: Medicare Other | Source: Ambulatory Visit | Attending: Nurse Practitioner | Admitting: Nurse Practitioner

## 2021-06-27 DIAGNOSIS — R229 Localized swelling, mass and lump, unspecified: Secondary | ICD-10-CM | POA: Insufficient documentation

## 2021-07-25 ENCOUNTER — Other Ambulatory Visit: Payer: Self-pay | Admitting: Nurse Practitioner

## 2021-07-25 ENCOUNTER — Other Ambulatory Visit (HOSPITAL_COMMUNITY): Payer: Self-pay | Admitting: Nurse Practitioner

## 2021-07-25 DIAGNOSIS — R9389 Abnormal findings on diagnostic imaging of other specified body structures: Secondary | ICD-10-CM

## 2021-07-25 DIAGNOSIS — R229 Localized swelling, mass and lump, unspecified: Secondary | ICD-10-CM

## 2021-08-28 ENCOUNTER — Ambulatory Visit (HOSPITAL_COMMUNITY)
Admission: RE | Admit: 2021-08-28 | Discharge: 2021-08-28 | Disposition: A | Discharge status: Home / Self Care | Payer: Medicare Other | Source: Ambulatory Visit | Attending: Nurse Practitioner | Admitting: Nurse Practitioner

## 2021-08-28 DIAGNOSIS — R229 Localized swelling, mass and lump, unspecified: Secondary | ICD-10-CM

## 2021-08-28 DIAGNOSIS — R9389 Abnormal findings on diagnostic imaging of other specified body structures: Secondary | ICD-10-CM | POA: Diagnosis present

## 2021-08-28 MED ORDER — IOHEXOL 300 MG/ML  SOLN
75.0000 mL | Freq: Once | INTRAMUSCULAR | Status: AC | PRN
  Administered 2021-08-28: 75 mL via INTRAVENOUS

## 2021-09-21 ENCOUNTER — Encounter: Payer: Self-pay | Admitting: *Deleted

## 2021-10-08 NOTE — Progress Notes (Unsigned)
Referring Provider: Hyler, Gwen Her, NP Primary Care Physician:  Beverly Milch, NP (Inactive) Primary Gastroenterologist:  Dr. Abbey Chatters  No chief complaint on file.   HPI:   Margaret Bishop is a 71 y.o. adult presenting today at the request of Hyler, Gwen Her, NP for dysphagia.  Today:        Colonoscopy 06/21/2015: 3 tubular adenomas, 1 hyperplastic polyp removed, significant looping of the colon.  Recommended next colonoscopy in 1-3 months at [facility] with fluoroscopy and overdue.  Would not attempt colonoscopy at [facility] unless using an overtube and patient lost 40 pounds.  Colonoscopy completed at [facility] 08/26/2015.  Report not available in care everywhere.  Weighed 231 lbs in 2017.     Past Medical History:  Diagnosis Date   Abdominal hernia    Arthritis    Bipolar disorder ([place])    GERD (gastroesophageal reflux disease)    History of kidney stones    Hx of blood clots    Left leg   Hypertension    Shortness of breath dyspnea    with exertion    Past Surgical History:  Procedure Laterality Date   CATARACT EXTRACTION W/PHACO Left 08/10/2019   Procedure: CATARACT EXTRACTION PHACO AND INTRAOCULAR LENS PLACEMENT LEFT EYE;  Surgeon: Baruch Goldmann, MD;  [facility];  Service: Ophthalmology;  Laterality: Left;  CDE: 6.03   CATARACT EXTRACTION W/PHACO Right 08/24/2019   Procedure: CATARACT EXTRACTION PHACO AND INTRAOCULAR LENS PLACEMENT (IOC);  Surgeon: Baruch Goldmann, MD;  Location: [facility];  Service: Ophthalmology;  Laterality: Right;  CDE: 5.18   CHOLECYSTECTOMY     COLONOSCOPY N/A 06/20/2015   Procedure: COLONOSCOPY;  Surgeon: Danie Binder, MD;  [facility];  Service: Endoscopy;  Laterality: N/A;  1100   INSERTION OF MESH N/A 11/30/2014   Procedure: INSERTION OF MESH;  Surgeon: Ralene Ok, MD;  Location: [place];  Service: General;  Laterality: N/A;   KNEE ARTHROPLASTY Left    UMBILICAL HERNIA REPAIR N/A 11/30/2014   Procedure: LAPAROSCOPIC  UMBILICAL HERNIA REPAIR WITH MESH;  Surgeon: Ralene Ok, MD;  Location: [place];  Service: General;  Laterality: N/A;    Current Outpatient Medications  Medication Sig Dispense Refill   acetaminophen (TYLENOL) 500 MG tablet Take 500 mg by mouth as needed for mild pain.     amLODipine-olmesartan (AZOR) 5-40 MG per tablet Take 1 tablet by mouth daily.     cephALEXin (KEFLEX) 500 MG capsule Take 1 capsule (500 mg total) by mouth 2 (two) times daily. 14 capsule 0   lamoTRIgine (LAMICTAL) 25 MG tablet Take 25 mg by mouth daily.     lithium 300 MG tablet Take 300 mg by mouth daily.      omeprazole (PRILOSEC) 20 MG capsule Take 20 mg by mouth daily.     vitamin B-12 (CYANOCOBALAMIN) 500 MCG tablet Take 500 mcg by mouth daily.     warfarin (COUMADIN) 4 MG tablet Take 4 mg by mouth daily.     No current facility-administered medications for this visit.    Allergies as of 10/11/2021 - Review Complete 06/23/2020  Allergen Reaction Noted   Onion Hives and Other (See Comments) 07/02/2012   Shellfish allergy Swelling 06/20/2015   Strawberry extract Hives 07/02/2012   Tomato Hives 07/02/2012    Family History  Problem Relation Age of Onset   Colon cancer Neg Hx     Social History   Socioeconomic History   Marital status: Single  Spouse name: Not on file   Number of children: Not on file   Years of education: Not on file   Highest education level: Not on file  Occupational History   Not on file  Tobacco Use   Smoking status: Never   Smokeless tobacco: Never  Substance and Sexual Activity   Alcohol use: No    Alcohol/week: 0.0 standard drinks of alcohol   Drug use: No   Sexual activity: Not on file  Other Topics Concern   Not on file  Social History Narrative   Not on file   Social Determinants of Health   Financial Resource Strain: Not on file  Food Insecurity: Not on file  Transportation Needs: Not on file  Physical Activity: Not on file  Stress: Not on file  Social  Connections: Not on file  Intimate Partner Violence: Not on file    Review of Systems: Gen: Denies any fever, chills, cold or flulike symptoms, presyncope, syncope. CV: Denies chest pain, heart palpitations.  Resp: Denies shortness of breath, cough.  GI: See HPI GU : Denies urinary burning, urinary frequency, urinary hesitancy MS: Denies joint pain. Derm: Denies rash. Psych: Denies depression, anxiety, memory loss, and confusion Heme: See HPI  Physical Exam: There were no vitals taken for this visit. General:   Alert and oriented. Pleasant and cooperative. Well-nourished and well-developed.  Head:  Normocephalic and atraumatic. Eyes:  Without icterus, sclera clear and conjunctiva pink.  Ears:  Normal auditory acuity. Lungs:  Clear to auscultation bilaterally. No wheezes, rales, or rhonchi. No distress.  Heart:  S1, S2 present without murmurs appreciated.  Abdomen:  +BS, soft, non-tender and non-distended. No HSM noted. No guarding or rebound. No masses appreciated.  Rectal:  Deferred  Msk:  Symmetrical without gross deformities. Normal posture. Extremities:  Without edema. Neurologic:  Alert and  oriented x4;  grossly normal neurologically. Skin:  Intact without significant lesions or rashes. Psych:  Normal mood and affect.    Assessment:     Plan:  ***   Aliene Altes, PA-C [facility] 10/11/2021

## 2021-10-11 ENCOUNTER — Ambulatory Visit (INDEPENDENT_AMBULATORY_CARE_PROVIDER_SITE_OTHER): Payer: Medicare Other | Admitting: Gastroenterology

## 2021-10-11 ENCOUNTER — Encounter: Payer: Self-pay | Admitting: Gastroenterology

## 2021-10-11 ENCOUNTER — Telehealth: Payer: Self-pay | Admitting: *Deleted

## 2021-10-11 VITALS — BP 172/70 | HR 78 | Temp 98.0°F | Ht 67.0 in | Wt 213.0 lb

## 2021-10-11 DIAGNOSIS — R1310 Dysphagia, unspecified: Secondary | ICD-10-CM | POA: Insufficient documentation

## 2021-10-11 DIAGNOSIS — Z86010 Personal history of colonic polyps: Secondary | ICD-10-CM | POA: Diagnosis not present

## 2021-10-11 NOTE — Telephone Encounter (Signed)
What type of surgery is being performed? EGD with possible esophageal dilation  When is surgery scheduled? TBD  Clearance to hold Eliquis 48 hours prior  Name of physician performing surgery?  Dr. Arvilla Me[facility] Phone: [phone] Fax: [phone]  Anethesia type (none, local, MAC, general)? MAC  Note sen tto Margaret Bishop that manages eliquis

## 2021-10-11 NOTE — Patient Instructions (Addendum)
We will arrange for you to have an upper endoscopy with possible dilation of your esophagus in the near future with Dr. Abbey Chatters. We will get approval from your primary care doctor to hold Eliquis for 2 days prior to your procedure.  Swallowing precautions:  Eat slowly, take small bites, chew thoroughly, drink plenty of liquids throughout meals.  Avoid trough textures All meats should be chopped finely.  If something gets hung in your esophagus and will not come up or go down, proceed to the emergency room.    We will follow-up with you in the office after your procedure.  Do not hesitate to call if you have any questions or concerns.  It was very nice to meet you today!  Aliene Altes, PA-C [facility]

## 2021-10-30 ENCOUNTER — Telehealth: Payer: Self-pay | Admitting: *Deleted

## 2021-10-30 NOTE — Telephone Encounter (Signed)
Pt called and left voicemail wanting to know about scheduling an EGD. Pt informed will have to wait to hear back from PCP to hold eliquis for 48 hours, once get the ok will contact her to schedule the procedure. Verbalized understanding.

## 2021-11-03 ENCOUNTER — Telehealth: Payer: Self-pay | Admitting: Gastroenterology

## 2021-11-03 NOTE — Telephone Encounter (Signed)
Called [facility] and had to leave message

## 2021-11-03 NOTE — Telephone Encounter (Signed)
Received colonoscopy records from [facility].  Colonoscopy performed 08/26/2015.  She had a normal exam.  Recommended 5-year repeat due to history of colon polyps.  No additional recommendations at this time.  We will discuss scheduling colonoscopyin the future at a follow-up visit.   Mindy/Tammy: Please follow-up with primary care provider regarding holding Eliquis so we can proceed with scheduling patient's upper endoscopy as planned.

## 2021-11-07 NOTE — Telephone Encounter (Signed)
Called [facility]. Did not see anything has been done with the fax we sent over. Message will be given to provider today to review and send a response back.

## 2021-11-08 NOTE — Telephone Encounter (Signed)
Received approval to hold Eliquis x 48 hours prior to EGD. Please proceed with scheduling as planned.

## 2021-11-08 NOTE — Telephone Encounter (Signed)
Fax received and given to [facility] to review

## 2021-11-10 NOTE — Telephone Encounter (Signed)
LMOVM to call back 

## 2021-11-13 NOTE — Telephone Encounter (Signed)
Spoke with pt. She has been scheduled for 10/23 at 7:30am. Aware will need pre-op appt prior. Aware to hold eliquis 48 hrs prior. She will pick up instructions for EGD when she goes to pre-op appt.

## 2021-11-13 NOTE — Telephone Encounter (Signed)
Spoke with pt and she is aware of pre-op appt details

## 2021-11-13 NOTE — Telephone Encounter (Signed)
PA approved. Auth# [id number], DOS: Nov 20, 2021 - Jan 28, 2022

## 2021-11-13 NOTE — Telephone Encounter (Signed)
Called pt mobile and LV with pre-op appt details

## 2021-11-15 NOTE — Patient Instructions (Signed)
Margaret Bishop  11/15/2021     '@PREFPERIOPPHARMACY'$ @   Your procedure is scheduled on  11/20/2021.   Report to [facility] at  0600  A.M.   Call this number if you have problems the morning of surgery:  [phone]  If you experience any cold or flu symptoms such as cough, fever, chills, shortness of breath, etc. between now and your scheduled surgery, please notify us at the above number.   Remember:  Follow the diet instructions given to you by the office.     Take these medicines the morning of surgery with A SIP OF WATER                            azor, lamictal, lithium, omeprazole.     Do not wear jewelry, make-up or nail polish.  Do not wear lotions, powders, or perfumes, or deodorant.  Do not shave 48 hours prior to surgery.  Men may shave face and neck.  Do not bring valuables to the hospital.  [facility] is not responsible for any belongings or valuables.  Contacts, dentures or bridgework may not be worn into surgery.  Leave your suitcase in the car.  After surgery it may be brought to your room.  For patients admitted to the hospital, discharge time will be determined by your treatment team.  Patients discharged the day of surgery will not be allowed to drive home and must have someone with them for 24 hours.     Special instructions:   DO NOT smoke tobacco or vape for 24 hours before your procedure.  Please read over the following fact sheets that you were given. Anesthesia Post-op Instructions and Care and Recovery After Surgery      Upper Endoscopy, Adult, Care After After the procedure, it is common to have a sore throat. It is also common to have: Mild stomach pain or discomfort. Bloating. Nausea. Follow these instructions at home: The instructions below may help you care for yourself at home. Your health care provider may give you more instructions. If you have questions, ask your health care provider. If you were given a sedative  during the procedure, it can affect you for several hours. Do not drive or operate machinery until your health care provider says that it is safe. If you will be going home right after the procedure, plan to have a responsible adult: Take you home from the hospital or clinic. You will not be allowed to drive. Care for you for the time you are told. Follow instructions from your health care provider about what you may eat and drink. Return to your normal activities as told by your health care provider. Ask your health care provider what activities are safe for you. Take over-the-counter and prescription medicines only as told by your health care provider. Contact a health care provider if you: Have a sore throat that lasts longer than one day. Have trouble swallowing. Have a fever. Get help right away if you: Vomit blood or your vomit looks like coffee grounds. Have bloody, black, or tarry stools. Have a very bad sore throat or you cannot swallow. Have difficulty breathing or very bad pain in your chest or abdomen. These symptoms may be an emergency. Get help right away. Call 911. Do not wait to see if the symptoms will go away. Do not drive yourself to the hospital. Summary After the procedure,  it is common to have a sore throat, mild stomach discomfort, bloating, and nausea. If you were given a sedative during the procedure, it can affect you for several hours. Do not drive until your health care provider says that it is safe. Follow instructions from your health care provider about what you may eat and drink. Return to your normal activities as told by your health care provider. This information is not intended to replace advice given to you by your health care provider. Make sure you discuss any questions you have with your health care provider. Document Revised: 04/26/2021 Document Reviewed: 04/26/2021 Elsevier Patient Education  [place]. Esophageal Dilatation Esophageal  dilatation, also called esophageal dilation, is a procedure to widen or open a blocked or narrowed part of the esophagus. The esophagus is the part of the body that moves food and liquid from the mouth to the stomach. You may need this procedure if: You have a buildup of scar tissue in your esophagus that makes it difficult, painful, or impossible to swallow. This can be caused by gastroesophageal reflux disease (GERD). You have cancer of the esophagus. There is a problem with how food moves through your esophagus. In some cases, you may need this procedure repeated at a later time to dilate the esophagus gradually. Tell a health care provider about: Any allergies you have. All medicines you are taking, including vitamins, herbs, eye drops, creams, and over-the-counter medicines. Any problems you or family members have had with anesthetic medicines. Any blood disorders you have. Any surgeries you have had. Any medical conditions you have. Any antibiotic medicines you are required to take before dental procedures. Whether you are pregnant or may be pregnant. What are the risks? Generally, this is a safe procedure. However, problems may occur, including: Bleeding due to a tear in the lining of the esophagus. A hole, or perforation, in the esophagus. What happens before the procedure? Ask your health care provider about: Changing or stopping your regular medicines. This is especially important if you are taking diabetes medicines or blood thinners. Taking medicines such as aspirin and ibuprofen. These medicines can thin your blood. Do not take these medicines unless your health care provider tells you to take them. Taking over-the-counter medicines, vitamins, herbs, and supplements. Follow instructions from your health care provider about eating or drinking restrictions. Plan to have a responsible adult take you home from the hospital or clinic. Plan to have a responsible adult care for you for  the time you are told after you leave the hospital or clinic. This is important. What happens during the procedure? You may be given a medicine to help you relax (sedative). A numbing medicine may be sprayed into the back of your throat, or you may gargle the medicine. Your health care provider may perform the dilatation using various surgical instruments, such as: Simple dilators. This instrument is carefully placed in the esophagus to stretch it. Guided wire bougies. This involves using an endoscope to insert a wire into the esophagus. A dilator is passed over this wire to enlarge the esophagus. Then the wire is removed. Balloon dilators. An endoscope with a small balloon is inserted into the esophagus. The balloon is inflated to stretch the esophagus and open it up. The procedure may vary among health care providers and hospitals. What can I expect after the procedure? Your blood pressure, heart rate, breathing rate, and blood oxygen level will be monitored until you leave the hospital or clinic. Your throat may  feel slightly sore and numb. This will get better over time. You will not be allowed to eat or drink until your throat is no longer numb. When you are able to drink, urinate, and sit on the edge of the bed without nausea or dizziness, you may be able to return home. Follow these instructions at home: Take over-the-counter and prescription medicines only as told by your health care provider. If you were given a sedative during the procedure, it can affect you for several hours. Do not drive or operate machinery until your health care provider says that it is safe. Plan to have a responsible adult care for you for the time you are told. This is important. Follow instructions from your health care provider about any eating or drinking restrictions. Do not use any products that contain nicotine or tobacco, such as cigarettes, e-cigarettes, and chewing tobacco. If you need help quitting, ask  your health care provider. Keep all follow-up visits. This is important. Contact a health care provider if: You have a fever. You have pain that is not relieved by medicine. Get help right away if: You have chest pain. You have trouble breathing. You have trouble swallowing. You vomit blood. You have black, tarry, or bloody stools. These symptoms may represent a serious problem that is an emergency. Do not wait to see if the symptoms will go away. Get medical help right away. Call your local emergency services (911 in the [place] Do not drive yourself to the hospital. Summary Esophageal dilatation, also called esophageal dilation, is a procedure to widen or open a blocked or narrowed part of the esophagus. Plan to have a responsible adult take you home from the hospital or clinic. For this procedure, a numbing medicine may be sprayed into the back of your throat, or you may gargle the medicine. Do not drive or operate machinery until your health care provider says that it is safe. This information is not intended to replace advice given to you by your health care provider. Make sure you discuss any questions you have with your health care provider. Document Revised: 06/03/2019 Document Reviewed: 06/03/2019 Elsevier Patient Education  [place] After This sheet gives you information about how to care for yourself after your procedure. Your health care provider may also give you more specific instructions. If you have problems or questions, contact your health care provider. What can I expect after the procedure? After the procedure, it is common to have: Tiredness. Forgetfulness about what happened after the procedure. Impaired judgment for important decisions. Nausea or vomiting. Some difficulty with balance. Follow these instructions at home: For the time period you were told by your health care provider:     Rest as needed. Do not  participate in activities where you could fall or become injured. Do not drive or use machinery. Do not drink alcohol. Do not take sleeping pills or medicines that cause drowsiness. Do not make important decisions or sign legal documents. Do not take care of children on your own. Eating and drinking Follow the diet that is recommended by your health care provider. Drink enough fluid to keep your urine pale yellow. If you vomit: Drink water, juice, or soup when you can drink without vomiting. Make sure you have little or no nausea before eating solid foods. General instructions Have a responsible adult stay with you for the time you are told. It is important to have someone help care for you until you  are awake and alert. Take over-the-counter and prescription medicines only as told by your health care provider. If you have sleep apnea, surgery and certain medicines can increase your risk for breathing problems. Follow instructions from your health care provider about wearing your sleep device: Anytime you are sleeping, including during daytime naps. While taking prescription pain medicines, sleeping medicines, or medicines that make you drowsy. Avoid smoking. Keep all follow-up visits as told by your health care provider. This is important. Contact a health care provider if: You keep feeling nauseous or you keep vomiting. You feel light-headed. You are still sleepy or having trouble with balance after 24 hours. You develop a rash. You have a fever. You have redness or swelling around the IV site. Get help right away if: You have trouble breathing. You have new-onset confusion at home. Summary For several hours after your procedure, you may feel tired. You may also be forgetful and have poor judgment. Have a responsible adult stay with you for the time you are told. It is important to have someone help care for you until you are awake and alert. Rest as told. Do not drive or operate  machinery. Do not drink alcohol or take sleeping pills. Get help right away if you have trouble breathing, or if you suddenly become confused. This information is not intended to replace advice given to you by your health care provider. Make sure you discuss any questions you have with your health care provider. Document Revised: 12/20/2020 Document Reviewed: 12/18/2018 Elsevier Patient Education  [place].

## 2021-11-17 ENCOUNTER — Encounter (HOSPITAL_COMMUNITY)
Admission: RE | Admit: 2021-11-17 | Discharge: 2021-11-17 | Disposition: A | Discharge status: Home / Self Care | Payer: Medicare Other | Source: Ambulatory Visit | Attending: Internal Medicine | Admitting: Internal Medicine

## 2021-11-17 ENCOUNTER — Encounter (HOSPITAL_COMMUNITY): Payer: Self-pay

## 2021-11-17 ENCOUNTER — Other Ambulatory Visit: Payer: Self-pay

## 2021-11-17 VITALS — BP 136/66 | HR 82 | Temp 97.5°F | Resp 18 | Ht 67.0 in | Wt 213.0 lb

## 2021-11-17 DIAGNOSIS — I10 Essential (primary) hypertension: Secondary | ICD-10-CM | POA: Diagnosis not present

## 2021-11-17 DIAGNOSIS — Z01812 Encounter for preprocedural laboratory examination: Secondary | ICD-10-CM | POA: Diagnosis not present

## 2021-11-20 ENCOUNTER — Encounter (HOSPITAL_COMMUNITY)
Admission: RE | Disposition: A | Discharge status: Home / Self Care | Payer: Self-pay | Source: Home / Self Care | Attending: Internal Medicine

## 2021-11-20 ENCOUNTER — Ambulatory Visit (HOSPITAL_BASED_OUTPATIENT_CLINIC_OR_DEPARTMENT_OTHER): Payer: Medicare Other | Admitting: Anesthesiology

## 2021-11-20 ENCOUNTER — Ambulatory Visit (HOSPITAL_COMMUNITY): Payer: Medicare Other | Admitting: Anesthesiology

## 2021-11-20 ENCOUNTER — Encounter (HOSPITAL_COMMUNITY): Payer: Self-pay

## 2021-11-20 ENCOUNTER — Ambulatory Visit (HOSPITAL_COMMUNITY)
Admission: RE | Admit: 2021-11-20 | Discharge: 2021-11-20 | Disposition: A | Discharge status: Home / Self Care | Payer: Medicare Other | Attending: Internal Medicine | Admitting: Internal Medicine

## 2021-11-20 DIAGNOSIS — K317 Polyp of stomach and duodenum: Secondary | ICD-10-CM | POA: Insufficient documentation

## 2021-11-20 DIAGNOSIS — K2950 Unspecified chronic gastritis without bleeding: Secondary | ICD-10-CM | POA: Insufficient documentation

## 2021-11-20 DIAGNOSIS — J45909 Unspecified asthma, uncomplicated: Secondary | ICD-10-CM | POA: Diagnosis not present

## 2021-11-20 DIAGNOSIS — F319 Bipolar disorder, unspecified: Secondary | ICD-10-CM | POA: Insufficient documentation

## 2021-11-20 DIAGNOSIS — I10 Essential (primary) hypertension: Secondary | ICD-10-CM | POA: Diagnosis not present

## 2021-11-20 DIAGNOSIS — K2970 Gastritis, unspecified, without bleeding: Secondary | ICD-10-CM | POA: Diagnosis not present

## 2021-11-20 DIAGNOSIS — R1310 Dysphagia, unspecified: Secondary | ICD-10-CM | POA: Diagnosis present

## 2021-11-20 DIAGNOSIS — K219 Gastro-esophageal reflux disease without esophagitis: Secondary | ICD-10-CM | POA: Insufficient documentation

## 2021-11-20 HISTORY — PX: ESOPHAGOGASTRODUODENOSCOPY (EGD) WITH PROPOFOL: SHX5813

## 2021-11-20 HISTORY — PX: BIOPSY: SHX5522

## 2021-11-20 SURGERY — ESOPHAGOGASTRODUODENOSCOPY (EGD) WITH PROPOFOL
Anesthesia: General

## 2021-11-20 MED ORDER — OMEPRAZOLE 40 MG PO CPDR: 40.0000 mg | DELAYED_RELEASE_CAPSULE | Freq: Two times a day (BID) | ORAL | 11 refills | Status: DC

## 2021-11-20 MED ORDER — LIDOCAINE HCL (CARDIAC) PF 100 MG/5ML IV SOSY
PREFILLED_SYRINGE | INTRAVENOUS | Status: DC | PRN
  Administered 2021-11-20: 50 mg via INTRATRACHEAL

## 2021-11-20 MED ORDER — PROPOFOL 10 MG/ML IV BOLUS
INTRAVENOUS | Status: DC | PRN
  Administered 2021-11-20: 100 mg via INTRAVENOUS
  Administered 2021-11-20: 40 mg via INTRAVENOUS

## 2021-11-20 MED ORDER — LACTATED RINGERS IV SOLN: INTRAVENOUS | Status: DC

## 2021-11-20 NOTE — H&P (Signed)
Primary Care Physician:  [facility] Primary Gastroenterologist:  Dr. Abbey Chatters  Pre-Procedure History & Physical: HPI:  Margaret Bishop is a 71 y.o. adult is  here for an EGD with possible dilation for GERD, dysphagia.   Past Medical History:  Diagnosis Date   Abdominal hernia    Anxiety    Arthritis    Asthma    Bipolar disorder ([place])    GERD (gastroesophageal reflux disease)    History of kidney stones    Hx of blood clots    Left leg   Hypertension    Shortness of breath dyspnea    with exertion    Past Surgical History:  Procedure Laterality Date   CATARACT EXTRACTION W/PHACO Left 08/10/2019   Procedure: CATARACT EXTRACTION PHACO AND INTRAOCULAR LENS PLACEMENT LEFT EYE;  Surgeon: Baruch Goldmann, MD;  Location: [facility];  Service: Ophthalmology;  Laterality: Left;  CDE: 6.03   CATARACT EXTRACTION W/PHACO Right 08/24/2019   Procedure: CATARACT EXTRACTION PHACO AND INTRAOCULAR LENS PLACEMENT (IOC);  Surgeon: Baruch Goldmann, MD;  Location: [facility];  Service: Ophthalmology;  Laterality: Right;  CDE: 5.18   CHOLECYSTECTOMY     COLONOSCOPY N/A 06/20/2015   Surgeon: Danie Binder, MD; 3 tubular adenomas, 1 hyperplastic polyp removed, significant looping of the colon.  Recommended next colonoscopy in 1-3 months at [facility] with fluoroscopy and overdue.  Would not attempt colonoscopy at [facility] unless using an overtube and patient lost 40 pounds.   COLONOSCOPY  07/2015   [facility]   INSERTION OF MESH N/A 11/30/2014   Procedure: INSERTION OF MESH;  Surgeon: Ralene Ok, MD;  Location: [facility];  Service: General;  Laterality: N/A;   KNEE ARTHROPLASTY Left    UMBILICAL HERNIA REPAIR N/A 11/30/2014   Procedure: LAPAROSCOPIC UMBILICAL HERNIA REPAIR WITH MESH;  Surgeon: Ralene Ok, MD;  Location: [place];  Service: General;  Laterality: N/A;    Prior to Admission medications   Medication Sig Start Date End Date Taking? Authorizing Provider  albuterol (VENTOLIN HFA) 108 (90  Base) MCG/ACT inhaler Inhale 1 puff into the lungs every 6 (six) hours as needed for shortness of breath or wheezing. 06/08/21  Yes [provider]  amLODipine-olmesartan (AZOR) 5-40 MG per tablet Take by mouth in the morning.   Yes [provider]  Cholecalciferol (VITAMIN D-3 PO) Take 1 tablet by mouth in the morning.   Yes [provider]  ELIQUIS 2.5 MG TABS tablet Take 2.5 mg by mouth 2 (two) times daily. 09/07/21  Yes [provider]  lamoTRIgine (LAMICTAL) 25 MG tablet Take 25 mg by mouth in the morning.   Yes [provider]  lithium carbonate 300 MG capsule Take 300 mg by mouth in the morning. 09/07/21  Yes [provider]  omeprazole (PRILOSEC) 40 MG capsule Take 40 mg by mouth daily before breakfast. 09/07/21  Yes [provider]  simvastatin (ZOCOR) 20 MG tablet Take 20 mg by mouth every evening. 09/05/21  Yes [provider]  vitamin B-12 (CYANOCOBALAMIN) 500 MCG tablet Take 500 mcg by mouth in the morning.   Yes [provider]  acetaminophen (TYLENOL) 500 MG tablet Take 500 mg by mouth every 6 (six) hours as needed (pain.).    [provider]    Allergies as of 11/13/2021 - Review Complete 11/13/2021  Allergen Reaction Noted   Onion Hives and Other (See Comments) 07/02/2012   Shellfish allergy Swelling 06/20/2015   Strawberry extract Hives 07/02/2012   Tomato  Hives 07/02/2012    Family History  Problem Relation Age of Onset   Colon cancer Neg Hx    Esophageal cancer Neg Hx     Social History   Socioeconomic History   Marital status: Single    Spouse name: Not on file   Number of children: Not on file   Years of education: Not on file   Highest education level: Not on file  Occupational History   Not on file  Tobacco Use   Smoking status: Never   Smokeless tobacco: Never  Vaping Use   Vaping Use: Never used  Substance and Sexual Activity   Alcohol use: No    Alcohol/week: 0.0  standard drinks of alcohol   Drug use: No   Sexual activity: Not Currently  Other Topics Concern   Not on file  Social History Narrative   Not on file   Social Determinants of Health   Financial Resource Strain: Not on file  Food Insecurity: Not on file  Transportation Needs: Not on file  Physical Activity: Not on file  Stress: Not on file  Social Connections: Not on file  Intimate Partner Violence: Not on file    Review of Systems: General: Negative for fever, chills, fatigue, weakness. Eyes: Negative for vision changes.  ENT: Negative for hoarseness, difficulty swallowing , nasal congestion. CV: Negative for chest pain, angina, palpitations, dyspnea on exertion, peripheral edema.  Respiratory: Negative for dyspnea at rest, dyspnea on exertion, cough, sputum, wheezing.  GI: See history of present illness. GU:  Negative for dysuria, hematuria, urinary incontinence, urinary frequency, nocturnal urination.  MS: Negative for joint pain, low back pain.  Derm: Negative for rash or itching.  Neuro: Negative for weakness, abnormal sensation, seizure, frequent headaches, memory loss, confusion.  Psych: Negative for anxiety, depression Endo: Negative for unusual weight change.  Heme: Negative for bruising or bleeding. Allergy: Negative for rash or hives.  Physical Exam: Vital signs in last 24 hours: Temp:  [98.3 F (36.8 C)] 98.3 F (36.8 C) (10/23 2376) Pulse Rate:  [69] 69 (10/23 0632) Resp:  [18] 18 (10/23 2831) BP: (162)/(84) 162/84 (10/23 5176) SpO2:  [100 %] 100 % (10/23 1607) Weight:  [96.6 kg] 96.6 kg (10/23 3710)   General:   Alert,  Well-developed, well-nourished, pleasant and cooperative in NAD Head:  Normocephalic and atraumatic. Eyes:  Sclera clear, no icterus.   Conjunctiva pink. Ears:  Normal auditory acuity. Nose:  No deformity, discharge,  or lesions. Mouth:  No deformity or lesions, dentition normal. Neck:  Supple; no masses or thyromegaly. Lungs:  Clear  throughout to auscultation.   No wheezes, crackles, or rhonchi. No acute distress. Heart:  Regular rate and rhythm; no murmurs, clicks, rubs,  or gallops. Abdomen:  Soft, nontender and nondistended. No masses, hepatosplenomegaly or hernias noted. Normal bowel sounds, without guarding, and without rebound.   Msk:  Symmetrical without gross deformities. Normal posture. Extremities:  Without clubbing or edema. Neurologic:  Alert and  oriented x4;  grossly normal neurologically. Skin:  Intact without significant lesions or rashes. Cervical Nodes:  No significant cervical adenopathy. Psych:  Alert and cooperative. Normal mood and affect.   Impression/Plan: Margaret Bishop is here for an EGD with possible dilation for GERD, dysphagia.   Risks, benefits, limitations, imponderables and alternatives regarding EGD have been reviewed with the patient. Questions have been answered. All parties agreeable.

## 2021-11-20 NOTE — Anesthesia Preprocedure Evaluation (Signed)
Anesthesia Evaluation  Patient identified by MRN, date of birth, ID band Patient awake    Reviewed: Allergy & Precautions, H&P , NPO status , Patient's Chart, lab work & pertinent test results, reviewed documented beta blocker date and time   Airway Mallampati: II  TM Distance: >3 FB Neck ROM: full    Dental no notable dental hx.    Pulmonary asthma ,    Pulmonary exam normal breath sounds clear to auscultation       Cardiovascular Exercise Tolerance: Good hypertension, negative cardio ROS   Rhythm:regular Rate:Normal     Neuro/Psych PSYCHIATRIC DISORDERS Anxiety Bipolar Disorder negative neurological ROS     GI/Hepatic Neg liver ROS, GERD  Medicated,  Endo/Other  negative endocrine ROS  Renal/GU negative Renal ROS  negative genitourinary   Musculoskeletal   Abdominal   Peds  Hematology negative hematology ROS (+)   Anesthesia Other Findings   Reproductive/Obstetrics negative OB ROS                             Anesthesia Physical Anesthesia Plan  ASA: 2  Anesthesia Plan: General   Post-op Pain Management:    Induction:   PONV Risk Score and Plan: Propofol infusion  Airway Management Planned:   Additional Equipment:   Intra-op Plan:   Post-operative Plan:   Informed Consent: I have reviewed the patients History and Physical, chart, labs and discussed the procedure including the risks, benefits and alternatives for the proposed anesthesia with the patient or authorized representative who has indicated his/her understanding and acceptance.     Dental Advisory Given  Plan Discussed with: CRNA  Anesthesia Plan Comments:         Anesthesia Quick Evaluation

## 2021-11-20 NOTE — Anesthesia Postprocedure Evaluation (Signed)
Anesthesia Post Note  Patient: Margaret Bishop  Procedure(s) Performed: ESOPHAGOGASTRODUODENOSCOPY (EGD) WITH PROPOFOL BIOPSY  Patient location during evaluation: Phase II Anesthesia Type: General Level of consciousness: awake Pain management: pain level controlled Vital Signs Assessment: post-procedure vital signs reviewed and stable Respiratory status: spontaneous breathing and respiratory function stable Cardiovascular status: blood pressure returned to baseline and stable Postop Assessment: no headache and no apparent nausea or vomiting Anesthetic complications: no Comments: Late entry   No notable events documented.   Last Vitals:  Vitals:   11/20/21 0632 11/20/21 0752  BP: (!) 162/84 131/63  Pulse: 69 78  Resp: 18 19  Temp: 36.8 C 36.8 C  SpO2: 100% 93%    Last Pain:  Vitals:   11/20/21 0752  TempSrc: Oral  PainSc: 0-No pain                 Louann Sjogren

## 2021-11-20 NOTE — Discharge Instructions (Signed)
EGD Discharge instructions Please read the instructions outlined below and refer to this sheet in the next few weeks. These discharge instructions provide you with general information on caring for yourself after you leave the hospital. Your doctor may also give you specific instructions. While your treatment has been planned according to the most current medical practices available, unavoidable complications occasionally occur. If you have any problems or questions after discharge, please call your doctor. ACTIVITY You may resume your regular activity but move at a slower pace for the next 24 hours.  Take frequent rest periods for the next 24 hours.  Walking will help expel (get rid of) the air and reduce the bloated feeling in your abdomen.  No driving for 24 hours (because of the anesthesia (medicine) used during the test).  You may shower.  Do not sign any important legal documents or operate any machinery for 24 hours (because of the anesthesia used during the test).  NUTRITION Drink plenty of fluids.  You may resume your normal diet.  Begin with a light meal and progress to your normal diet.  Avoid alcoholic beverages for 24 hours or as instructed by your caregiver.  MEDICATIONS You may resume your normal medications unless your caregiver tells you otherwise.  WHAT YOU CAN EXPECT TODAY You may experience abdominal discomfort such as a feeling of fullness or "gas" pains.  FOLLOW-UP Your doctor will discuss the results of your test with you.  SEEK IMMEDIATE MEDICAL ATTENTION IF ANY OF THE FOLLOWING OCCUR: Excessive nausea (feeling sick to your stomach) and/or vomiting.  Severe abdominal pain and distention (swelling).  Trouble swallowing.  Temperature over 101 F (37.8 C).  Rectal bleeding or vomiting of blood.    Your EGD revealed mild amount inflammation in your stomach.  I took biopsies of this to rule out infection with a bacteria called H. pylori.  Await pathology results, my  office will contact you.  You also had a few small polyps in your stomach which I sampled.  These are likely benign, nothing to worry about.  Your esophagus was wide open.  I did not need to stretch it today.  I am going to increase your omeprazole to 40 mg twice daily for the next 12 weeks at which point you go back down to once daily.  Follow-up with GI in 2 to 3 months.  We may need to consider swallow study as next step.  I hope you have a great rest of your week!  Elon Alas. Abbey Chatters, D.O. Gastroenterology and Hepatology [facility]

## 2021-11-20 NOTE — Transfer of Care (Signed)
Immediate Anesthesia Transfer of Care Note  Patient: Margaret Bishop  Procedure(s) Performed: ESOPHAGOGASTRODUODENOSCOPY (EGD) WITH PROPOFOL BIOPSY  Patient Location: Short Stay  Anesthesia Type:General  Level of Consciousness: awake, alert , oriented and patient cooperative  Airway & Oxygen Therapy: Patient Spontanous Breathing and Patient connected to nasal cannula oxygen  Post-op Assessment: Report given to RN, Post -op Vital signs reviewed and stable and Patient moving all extremities  Post vital signs: Reviewed and stable  Last Vitals:  Vitals Value Taken Time  BP 131/63 11/20/21 0752  Temp 36.8 C 11/20/21 0752  Pulse 78 11/20/21 0752  Resp 19 11/20/21 0752  SpO2 93 % 11/20/21 0752    Last Pain:  Vitals:   11/20/21 0752  TempSrc: Oral  PainSc: 0-No pain         Complications: No notable events documented.

## 2021-11-20 NOTE — Op Note (Signed)
[facility] Patient Name: Margaret Bishop Procedure Date: 11/20/2021 7:13 AM MRN: [id number] Date of Birth: 13-Oct-1950 Attending MD: Elon Alas. Abbey Chatters DO CSN: [id number] Age: 71 Admit Type: Outpatient Procedure:                Upper GI endoscopy Indications:              Dysphagia Providers:                Elon Alas. Abbey Chatters, DO, Janeece Riggers, RN, Kristine L.                            Risa Grill, Technician Referring MD:              Medicines:                See the Anesthesia note for documentation of the                            administered medications Complications:            No immediate complications. Estimated Blood Loss:     Estimated blood loss was minimal. Procedure:                Pre-Anesthesia Assessment:                           - The anesthesia plan was to use monitored                            anesthesia care (MAC).                           After obtaining informed consent, the endoscope was                            passed under direct vision. Throughout the                            procedure, the patient's blood pressure, pulse, and                            oxygen saturations were monitored continuously. The                            [id number]-H190 ([id number]) scope was introduced through the                            mouth, and advanced to the second part of duodenum.                            The upper GI endoscopy was accomplished without                            difficulty. The patient tolerated the procedure                            well. Scope In: 7:40:21 AM  Scope Out: 7:45:57 AM Total Procedure Duration: 0 hours 5 minutes 36 seconds  Findings:      The Z-line was regular and was found 38 cm from the incisors.      There is no endoscopic evidence of stenosis or stricture in the entire       esophagus.      Patchy mild inflammation characterized by erythema was found in the       gastric body and in the gastric antrum. Biopsies were taken  with a cold       forceps for Helicobacter pylori testing.      The duodenal bulb, first portion of the duodenum and second portion of       the duodenum were normal.      Multiple small sessile polyps with no bleeding and no stigmata of recent       bleeding were found in the gastric body. Few were sampled with a cold       forceps for histology. Impression:               - Z-line regular, 38 cm from the incisors.                           - Gastritis. Biopsied.                           - Normal duodenal bulb, first portion of the                            duodenum and second portion of the duodenum.                           - Multiple gastric polyps. Biopsied. Moderate Sedation:      Per Anesthesia Care Recommendation:           - Patient has a contact number available for                            emergencies. The signs and symptoms of potential                            delayed complications were discussed with the                            patient. Return to normal activities tomorrow.                            Written discharge instructions were provided to the                            patient.                           - Resume previous diet.                           - Continue present medications.                           -  Await pathology results.                           - Use Prilosec (omeprazole) 40 mg PO BID for 12                            weeks.                           - No aspirin, ibuprofen, naproxen, or other                            non-steroidal anti-inflammatory drugs.                           - Return to GI clinic in 3 months. Consider MBSS to                            further evaluate. Procedure Code(s):        --- Professional ---                           [phone], Esophagogastroduodenoscopy, flexible,                            transoral; with biopsy, single or multiple Diagnosis Code(s):        --- Professional ---                            K29.70, Gastritis, unspecified, without bleeding                           K31.7, Polyp of stomach and duodenum                           R13.10, Dysphagia, unspecified CPT copyright 2019 American Medical Association. All rights reserved. The codes documented in this report are preliminary and upon coder review may  be revised to meet current compliance requirements. Elon Alas. Abbey Chatters, DO [place] [address] Abbey Chatters, DO 11/20/2021 7:50:41 AM This report has been signed electronically. Number of Addenda: 0

## 2021-11-23 ENCOUNTER — Encounter (HOSPITAL_COMMUNITY): Payer: Self-pay | Admitting: Internal Medicine

## 2021-11-23 LAB — SURGICAL PATHOLOGY

## 2021-12-18 ENCOUNTER — Other Ambulatory Visit (HOSPITAL_COMMUNITY): Payer: Self-pay | Admitting: Adult Health

## 2021-12-18 DIAGNOSIS — Z1231 Encounter for screening mammogram for malignant neoplasm of breast: Secondary | ICD-10-CM

## 2022-03-02 ENCOUNTER — Ambulatory Visit (HOSPITAL_COMMUNITY)
Admission: RE | Admit: 2022-03-02 | Discharge: 2022-03-02 | Disposition: A | Discharge status: Home / Self Care | Payer: 59 | Source: Ambulatory Visit | Attending: Adult Health | Admitting: Adult Health

## 2022-03-02 DIAGNOSIS — Z1231 Encounter for screening mammogram for malignant neoplasm of breast: Secondary | ICD-10-CM

## 2022-06-21 IMAGING — CT CT HEAD W/O CM
3 series · 15 of 47 positions shown, 18 images · non-contrast
Comparison: CT head [DATE]

CLINICAL DATA: Delirium. Son believes pt is taking her xanax more
than prescribed since her mother passed on [DATE] AMS

EXAM:
CT HEAD WITHOUT CONTRAST
TECHNIQUE: Contiguous axial images were obtained from the base of the skull
through the vertex without intravenous contrast.

[Series 2: head w o · axial · 0.43mm/px · z∈[-27,+98]mm · 9 of 31 slices shown, 12 images]
[im 3/31  brain]
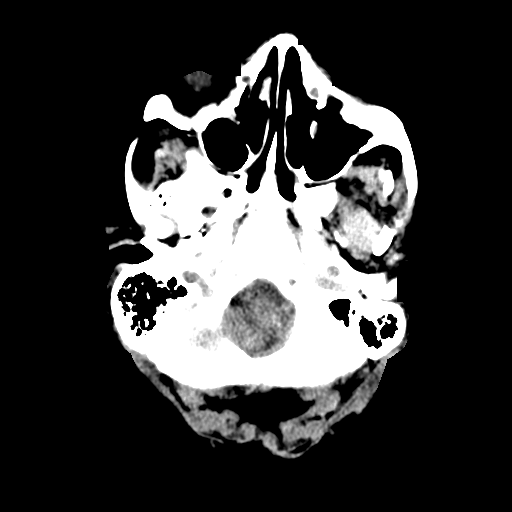
[im 3/31  bone]
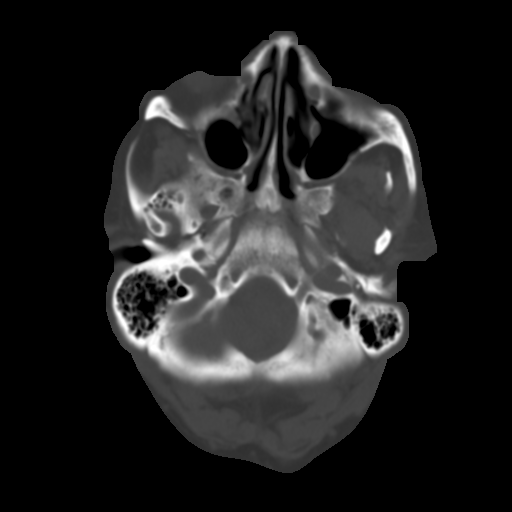
[im 6/31  brain]
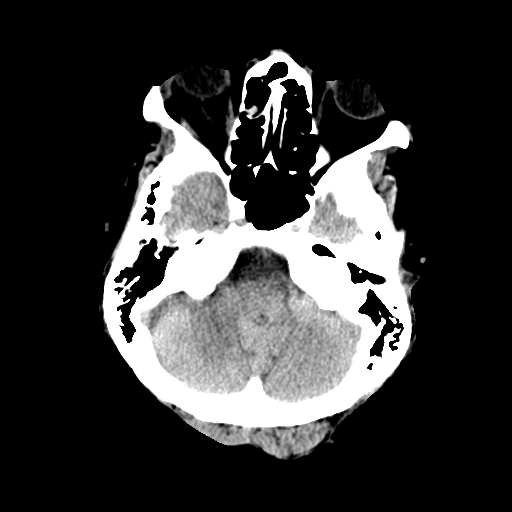
[im 9/31  brain]
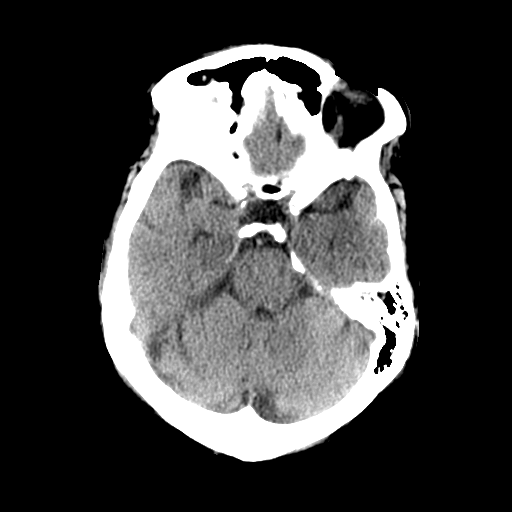
[im 12/31  brain]
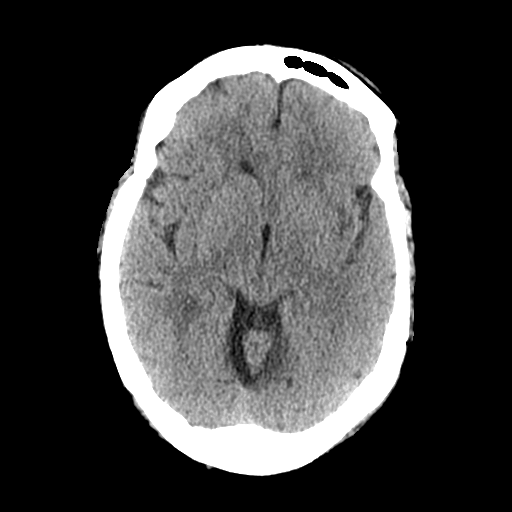
[im 16/31  brain]
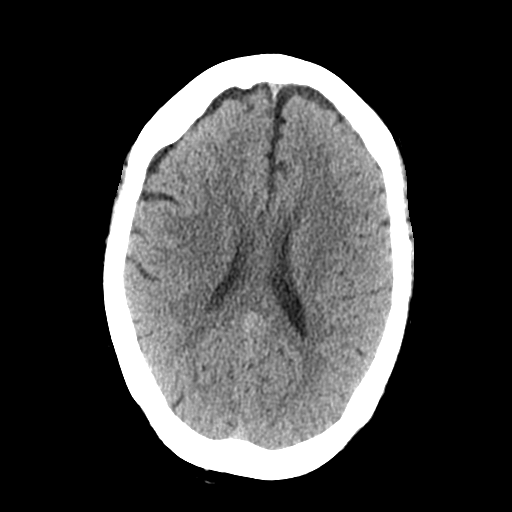
[im 16/31  bone]
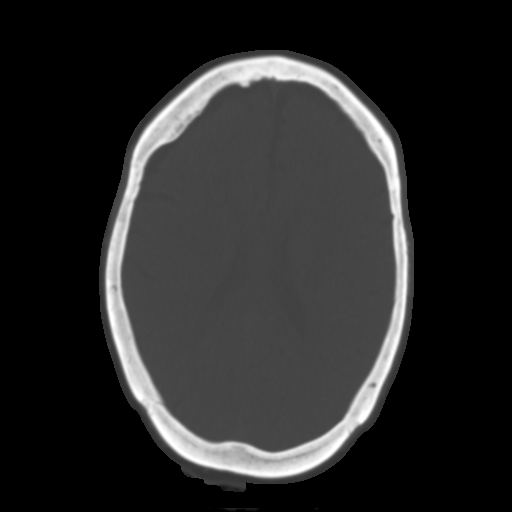
[im 19/31  brain]
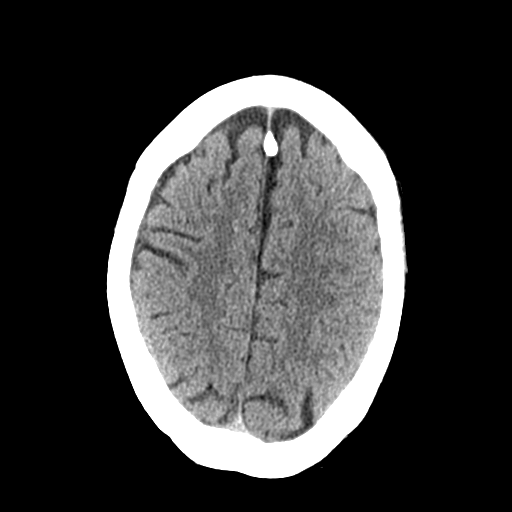
[im 22/31  brain]
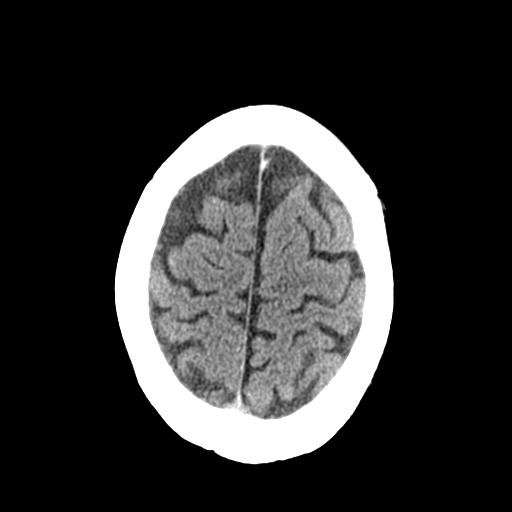
[im 25/31  brain]
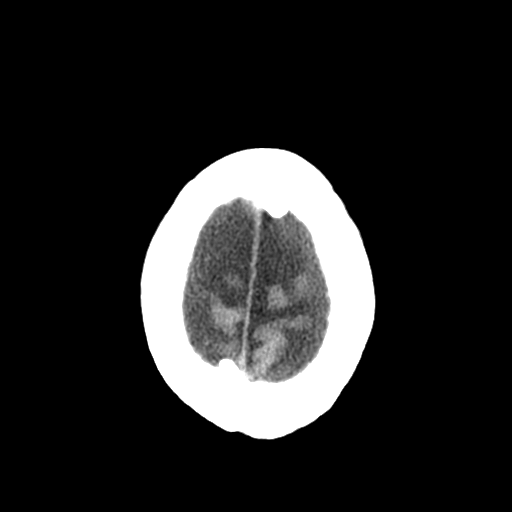
[im 28/31  brain]
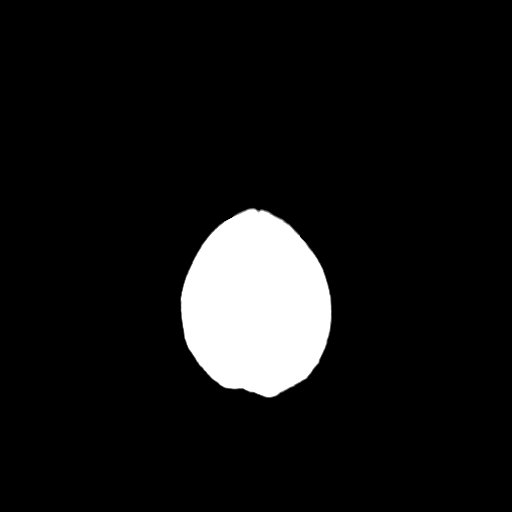
[im 28/31  bone]
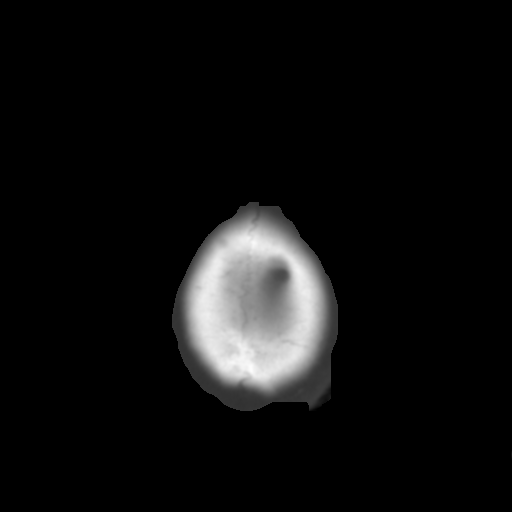

[Series 4: coronal soft · coronal · 0.30mm/px · 3 of 67 slices shown]
[im 23/67  brain]
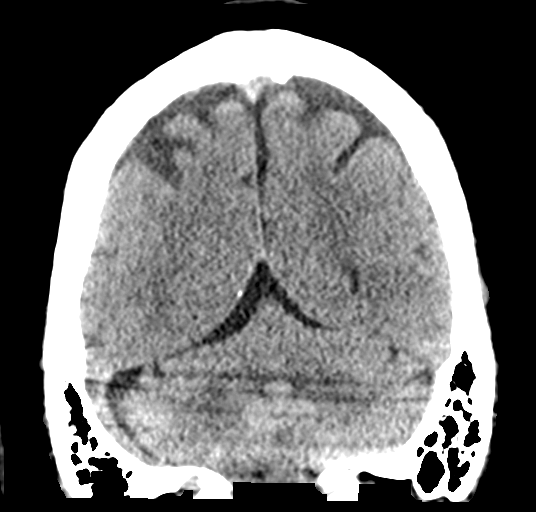
[im 30/67  brain]
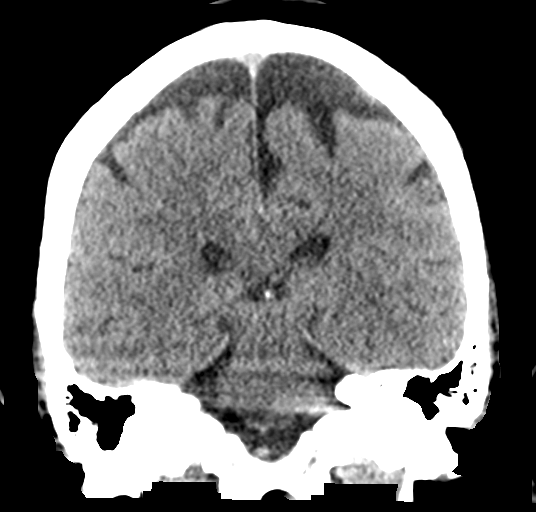
[im 37/67  brain]
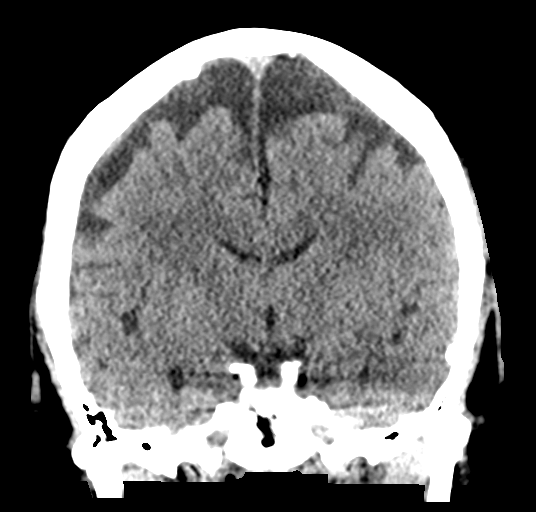

[Series 5: sagittal soft · sagittal · 0.30mm/px · 3 of 51 slices shown]
[im 17/51  brain]
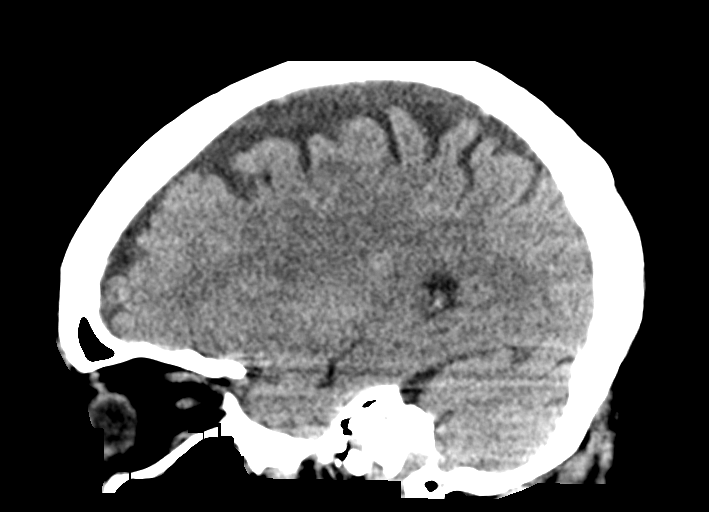
[im 26/51  brain]
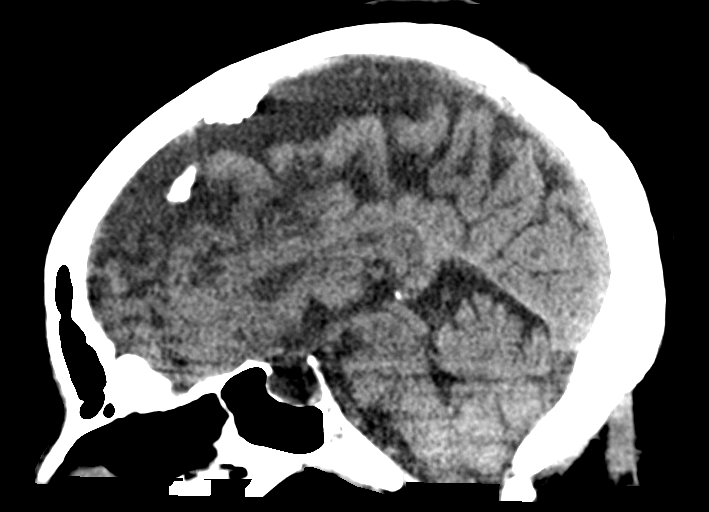
[im 34/51  brain]
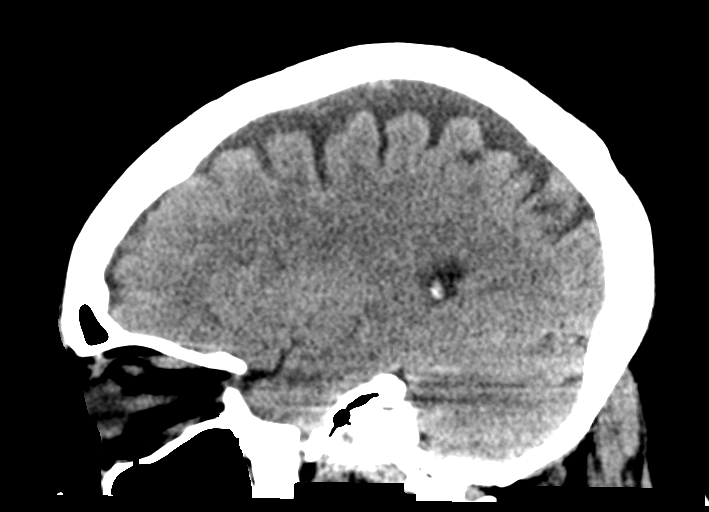

[15 of 47 positions shown; findings below may reference images not displayed]

FINDINGS: Brain:

Cerebral ventricle sizes are concordant with the degree of cerebral
volume loss.

No evidence of large-territorial acute infarction. No parenchymal
hemorrhage. No mass lesion. No extra-axial collection.

No mass effect or midline shift. No hydrocephalus. Basilar cisterns
are patent.

Vascular: No hyperdense vessel.

Skull: No acute fracture or focal lesion.

Sinuses/Orbits: Paranasal sinuses and mastoid air cells are clear.
The orbits are unremarkable.

Other: None.
IMPRESSION: No acute intracranial abnormality.

## 2022-12-07 ENCOUNTER — Other Ambulatory Visit: Payer: Self-pay | Admitting: Internal Medicine

## 2022-12-07 NOTE — Telephone Encounter (Signed)
Sending in limited refills.  Patient needs routine office visit for additional refills.  Please arrange.

## 2022-12-23 NOTE — Progress Notes (Unsigned)
Referring Provider: Ponciano Ort [facility] Primary Care Physician:  Ponciano Ort, [facility] Primary GI Physician: Dr. Marletta Lor  No chief complaint on file.   HPI:   Margaret Bishop is a 72 y.o. adult female with history of  asthma, anxiety, bipolar disorder, HTN, remote history of DVTs on Eliquis, adenomatous colon polyps overdue for surveillance, GERD, dysphagia, presenting today for medication refills ***  Last seen in the office at the time of initial consult on 10/11/2021.  She reported 3 months of progressive dysphagia, now occurring with all meals and textures.  Trouble with liquids at times.  Felt items were getting hung in her throat, more on the left side.  Occasional regurgitation.  Previously on omeprazole 20 mg daily that was increased to 40 mg daily per PCP with no change in dysphagia.  Recommended proceeding with EGD.    EGD 11/20/2021: Normal esophagus, gastritis biopsied, multiple gastric polyps biopsied.  Pathology showed hyperplastic gastric polyps, gastric antral and oxyntic mucosa with chronic gastritis, negative for H. pylori.  Recommended PPI twice daily x 12 weeks.  Consider modified barium swallow to further evaluate dysphagia.   Today:      Colonoscopy 06/21/2015: 3 tubular adenomas, 1 hyperplastic polyp removed, significant looping of the colon.  Recommended next colonoscopy in 1-3 months at [facility] with fluoroscopy and overdue.  Would not attempt colonoscopy at [facility] unless using an overtube and patient lost 40 pounds.   Colonoscopy completed at [facility] 08/26/2015  with normal exam.  Recommended 5-year surveillance due to history of colon polyps.  Past Medical History:  Diagnosis Date   Abdominal hernia    Anxiety    Arthritis    Asthma    Bipolar disorder (HCC)    GERD (gastroesophageal reflux disease)    History of kidney stones    Hx of blood clots    Left leg   Hypertension    Shortness of breath dyspnea    with exertion    Past  Surgical History:  Procedure Laterality Date   BIOPSY  11/20/2021   Procedure: BIOPSY;  Surgeon: Lanelle Bal, DO;  [facility];  Service: Endoscopy;;   CATARACT EXTRACTION W/PHACO Left 08/10/2019   Procedure: CATARACT EXTRACTION PHACO AND INTRAOCULAR LENS PLACEMENT LEFT EYE;  Surgeon: Fabio Pierce, MD;  Location: [facility] ORS;  Service: Ophthalmology;  Laterality: Left;  CDE: 6.03   CATARACT EXTRACTION W/PHACO Right 08/24/2019   Procedure: CATARACT EXTRACTION PHACO AND INTRAOCULAR LENS PLACEMENT (IOC);  Surgeon: Fabio Pierce, MD;  Location: [facility] ORS;  Service: Ophthalmology;  Laterality: Right;  CDE: 5.18   CHOLECYSTECTOMY     COLONOSCOPY N/A 06/20/2015   Surgeon: West Bali, MD; 3 tubular adenomas, 1 hyperplastic polyp removed, significant looping of the colon.  Recommended next colonoscopy in 1-3 months at [facility] with fluoroscopy and overdue.  Would not attempt colonoscopy at [facility] unless using an overtube and patient lost 40 pounds.   COLONOSCOPY  07/2015   [facility]   ESOPHAGOGASTRODUODENOSCOPY (EGD) WITH PROPOFOL N/A 11/20/2021   Procedure: ESOPHAGOGASTRODUODENOSCOPY (EGD) WITH PROPOFOL;  Surgeon: Lanelle Bal, DO;  Location: [facility];  Service: Endoscopy;  Laterality: N/A;  7:30am, asa 3   INSERTION OF MESH N/A 11/30/2014   Procedure: INSERTION OF MESH;  Surgeon: Axel Filler, MD;  Location: [facility] OR;  Service: General;  Laterality: N/A;   KNEE ARTHROPLASTY Left    UMBILICAL HERNIA REPAIR N/A 11/30/2014   Procedure: LAPAROSCOPIC UMBILICAL HERNIA REPAIR WITH MESH;  Surgeon: Axel Filler, MD;  Location: [facility] OR;  Service: General;  Laterality: N/A;    Current Outpatient Medications  Medication Sig Dispense Refill   acetaminophen (TYLENOL) 500 MG tablet Take 500 mg by mouth every 6 (six) hours as needed (pain.).     albuterol (VENTOLIN HFA) 108 (90 Base) MCG/ACT inhaler Inhale 1 puff into the lungs every 6 (six) hours as needed for shortness of breath or  wheezing.     amLODipine-olmesartan (AZOR) 5-40 MG per tablet Take by mouth in the morning.     Cholecalciferol (VITAMIN D-3 PO) Take 1 tablet by mouth in the morning.     ELIQUIS 2.5 MG TABS tablet Take 2.5 mg by mouth 2 (two) times daily.     lamoTRIgine (LAMICTAL) 25 MG tablet Take 25 mg by mouth in the morning.     lithium carbonate 300 MG capsule Take 300 mg by mouth in the morning.     omeprazole (PRILOSEC) 40 MG capsule TAKE 1 CAPSULE BY MOUTH TWICE DAILY. 60 capsule 1   simvastatin (ZOCOR) 20 MG tablet Take 20 mg by mouth every evening.     vitamin B-12 (CYANOCOBALAMIN) 500 MCG tablet Take 500 mcg by mouth in the morning.     No current facility-administered medications for this visit.    Allergies as of 12/24/2022 - Review Complete 11/20/2021  Allergen Reaction Noted   Onion Hives and Other (See Comments) 07/02/2012   Shellfish allergy Swelling 06/20/2015   Strawberry extract Hives 07/02/2012   Tomato Hives 07/02/2012    Family History  Problem Relation Age of Onset   Colon cancer Neg Hx    Esophageal cancer Neg Hx     Social History   Socioeconomic History   Marital status: Single    Spouse name: Not on file   Number of children: Not on file   Years of education: Not on file   Highest education level: Not on file  Occupational History   Not on file  Tobacco Use   Smoking status: Never   Smokeless tobacco: Never  Vaping Use   Vaping status: Never Used  Substance and Sexual Activity   Alcohol use: No    Alcohol/week: 0.0 standard drinks of alcohol   Drug use: No   Sexual activity: Not Currently  Other Topics Concern   Not on file  Social History Narrative   Not on file   Social Determinants of Health   Financial Resource Strain: Not on file  Food Insecurity: Not on file  Transportation Needs: Not on file  Physical Activity: Not on file  Stress: Not on file  Social Connections: Not on file    Review of Systems: Gen: Denies fever, chills, anorexia.  Denies fatigue, weakness, weight loss.  CV: Denies chest pain, palpitations, syncope, peripheral edema, and claudication. Resp: Denies dyspnea at rest, cough, wheezing, coughing up blood, and pleurisy. GI: Denies vomiting blood, jaundice, and fecal incontinence.   Denies dysphagia or odynophagia. Derm: Denies rash, itching, dry skin Psych: Denies depression, anxiety, memory loss, confusion. No homicidal or suicidal ideation.  Heme: Denies bruising, bleeding, and enlarged lymph nodes.  Physical Exam: There were no vitals taken for this visit. General:   Alert and oriented. No distress noted. Pleasant and cooperative.  Head:  Normocephalic and atraumatic. Eyes:  Conjuctiva clear without scleral icterus. Heart:  S1, S2 present without murmurs appreciated. Lungs:  Clear to auscultation bilaterally. No wheezes, rales, or rhonchi. No distress.  Abdomen:  +BS, soft, non-tender and non-distended.  No rebound or guarding. No HSM or masses noted. Msk:  Symmetrical without gross deformities. Normal posture. Extremities:  Without edema. Neurologic:  Alert and  oriented x4 Psych:  Normal mood and affect.    Assessment:     Plan:  ***   Ermalinda Memos, PA-C [facility] 12/24/2022

## 2022-12-24 ENCOUNTER — Ambulatory Visit (INDEPENDENT_AMBULATORY_CARE_PROVIDER_SITE_OTHER): Payer: 59 | Admitting: Gastroenterology

## 2022-12-24 ENCOUNTER — Encounter: Payer: Self-pay | Admitting: Gastroenterology

## 2022-12-24 VITALS — BP 138/83 | HR 81 | Temp 97.7°F | Ht 67.0 in | Wt 220.2 lb

## 2022-12-24 DIAGNOSIS — Z860100 Personal history of colon polyps, unspecified: Secondary | ICD-10-CM

## 2022-12-24 DIAGNOSIS — K219 Gastro-esophageal reflux disease without esophagitis: Secondary | ICD-10-CM

## 2022-12-24 DIAGNOSIS — Z860101 Personal history of adenomatous and serrated colon polyps: Secondary | ICD-10-CM | POA: Diagnosis not present

## 2022-12-24 DIAGNOSIS — R1310 Dysphagia, unspecified: Secondary | ICD-10-CM | POA: Diagnosis not present

## 2022-12-24 MED ORDER — OMEPRAZOLE 40 MG PO CPDR: 40.0000 mg | DELAYED_RELEASE_CAPSULE | Freq: Every day | ORAL | 5 refills | Status: AC

## 2022-12-24 NOTE — Patient Instructions (Signed)
Continue omeprazole 40 mg daily 30 minutes before breakfast.  We will keep scheduled for swallowing study at [facility].  Swallowing precautions:  Eat slowly, take small bites, chew thoroughly, drink plenty of liquids throughout meals.  Avoid trough textures All meats should be chopped finely.   Let me know when you would like to schedule colonoscopy.  I will see back in 6 months or sooner if needed.  It was great to see you again today!  Ermalinda Memos, PA-C [facility]

## 2022-12-26 ENCOUNTER — Ambulatory Visit (HOSPITAL_COMMUNITY)
Admission: RE | Admit: 2022-12-26 | Discharge: 2022-12-26 | Disposition: A | Discharge status: Home / Self Care | Payer: 59 | Source: Ambulatory Visit | Attending: Gastroenterology | Admitting: Gastroenterology

## 2022-12-26 DIAGNOSIS — R1310 Dysphagia, unspecified: Secondary | ICD-10-CM | POA: Diagnosis present

## 2023-01-14 ENCOUNTER — Other Ambulatory Visit (HOSPITAL_COMMUNITY): Payer: Self-pay | Admitting: Occupational Therapy

## 2023-01-14 ENCOUNTER — Other Ambulatory Visit: Payer: Self-pay | Admitting: *Deleted

## 2023-01-14 DIAGNOSIS — K219 Gastro-esophageal reflux disease without esophagitis: Secondary | ICD-10-CM

## 2023-01-14 DIAGNOSIS — R1310 Dysphagia, unspecified: Secondary | ICD-10-CM

## 2023-01-14 DIAGNOSIS — R1312 Dysphagia, oropharyngeal phase: Secondary | ICD-10-CM

## 2023-02-14 ENCOUNTER — Encounter: Payer: Self-pay | Admitting: Obstetrics & Gynecology

## 2023-02-15 ENCOUNTER — Ambulatory Visit: Payer: 59 | Admitting: Obstetrics & Gynecology

## 2023-02-15 ENCOUNTER — Encounter: Payer: Self-pay | Admitting: Obstetrics & Gynecology

## 2023-02-15 VITALS — BP 149/76 | HR 72 | Ht 67.2 in | Wt 219.6 lb

## 2023-02-15 DIAGNOSIS — N950 Postmenopausal bleeding: Secondary | ICD-10-CM

## 2023-02-15 NOTE — Progress Notes (Signed)
GYN VISIT Patient name: Margaret Bishop MRN [id number]  Date of birth: 06-07-1950 Chief Complaint:   referral  History of Present Illness:   Margaret Bishop is a 73 y.o. G2P2 PM female being seen today for the following concerns:     PMB:  All at once had a "period" that started around Nov-December.  Bleeding will last for about 3 days- using Depends and pads changing about 3x per day. Denies BRB.  Bleeding has occurred about every week.   Pt on Eliquis since 2010- denies prior episodes of bleeding  She reports no other acute complaints or concerns.     No LMP recorded. Patient is postmenopausal.    Review of Systems:   Pertinent items are noted in HPI Denies fever/chills, dizziness, headaches, visual disturbances, fatigue, shortness of breath, chest pain, abdominal pain, vomiting, no problems with bowel movements, urination, or intercourse unless otherwise stated above.  Pertinent History Reviewed:   Past Surgical History:  Procedure Laterality Date   BIOPSY  11/20/2021   Procedure: BIOPSY;  Surgeon: Lanelle Bal, DO;  [facility];  Service: Endoscopy;;   CATARACT EXTRACTION W/PHACO Left 08/10/2019   Procedure: CATARACT EXTRACTION PHACO AND INTRAOCULAR LENS PLACEMENT LEFT EYE;  Surgeon: Fabio Pierce, MD;  Location: [facility];  Service: Ophthalmology;  Laterality: Left;  CDE: 6.03   CATARACT EXTRACTION W/PHACO Right 08/24/2019   Procedure: CATARACT EXTRACTION PHACO AND INTRAOCULAR LENS PLACEMENT (IOC);  Surgeon: Fabio Pierce, MD;  Location: [facility];  Service: Ophthalmology;  Laterality: Right;  CDE: 5.18   CHOLECYSTECTOMY     COLONOSCOPY N/A 06/20/2015   Surgeon: West Bali, MD; 3 tubular adenomas, 1 hyperplastic polyp removed, significant looping of the colon.  Recommended next colonoscopy in 1-3 months at [facility] with fluoroscopy and overdue.  Would not attempt colonoscopy at [facility] unless using an overtube and patient lost 40 pounds.   COLONOSCOPY   07/2015   [facility]   ESOPHAGOGASTRODUODENOSCOPY (EGD) WITH PROPOFOL N/A 11/20/2021   Procedure: ESOPHAGOGASTRODUODENOSCOPY (EGD) WITH PROPOFOL;  Surgeon: Lanelle Bal, DO;  [facility];  Service: Endoscopy;  Laterality: N/A;  7:30am, asa 3   INSERTION OF MESH N/A 11/30/2014   Procedure: INSERTION OF MESH;  Surgeon: Axel Filler, MD;  Location: [facility] OR;  Service: General;  Laterality: N/A;   KNEE ARTHROPLASTY Left    UMBILICAL HERNIA REPAIR N/A 11/30/2014   Procedure: LAPAROSCOPIC UMBILICAL HERNIA REPAIR WITH MESH;  Surgeon: Axel Filler, MD;  Location: [facility] OR;  Service: General;  Laterality: N/A;    Past Medical History:  Diagnosis Date   Abdominal hernia    Anxiety    Arthritis    Asthma    Bipolar disorder (HCC)    GERD (gastroesophageal reflux disease)    History of kidney stones    Hx of blood clots    Left leg   Hypertension    Shortness of breath dyspnea    with exertion   Reviewed problem list, medications and allergies. Physical Assessment:   Vitals:   02/15/23 0940  BP: (!) 149/76  Pulse: 72  Weight: 219 lb 9.6 oz (99.6 kg)  Height: 5' 7.2" (1.707 m)  Body mass index is 34.19 kg/m.       Physical Examination:   General appearance: alert, well appearing, and in no distress  Psych: mood appropriate, normal affect  Skin: warm & dry   Cardiovascular: normal heart rate noted  Respiratory: normal respiratory effort, no distress  Abdomen: obese,  soft, non-tender   Pelvic: VULVA: normal appearing vulva with no masses, tenderness or lesions, VAGINA: normal appearing vagina with normal color and discharge, no lesions.  Pt noted considerable discomfort with placement of speculum.  Diffuclty with visualization of cervix.  On bimanual exam- cervix midline, normal sized uterus.  No masses appreciated  Extremities: 2+ edema, limited mobility  Chaperone: Federico Flake    Endometrial Biopsy Procedure Note  Pre-operative Diagnosis: Postmenopausal  bleeding  Post-operative Diagnosis: same  Procedure Details   The risks (including infection, bleeding, pain, and uterine perforation) and benefits of the procedure were explained to the patient and Written informed consent was obtained.  Antibiotic prophylaxis against endocarditis was not indicated.   The patient was placed in the dorsal lithotomy position.  Bimanual exam showed the uterus to be in the neutral position.  Difficulty with cervical visualization.  The cervix was prepped with betadine.  However, pt could not tolerate exam long enough to complete endometrial biopsy.  Instruments were removed.  Condition: Stable  Complications: Unable to complete procedure   Assessment & Plan:  1) Postmenopausal bleeding -discussed potential causes ranging from atrophy to endometrial carcinoma -recommendation for EMB, counseled as above.  Unable to complete procedure due to patient discomfort -Will plan for pelvic US, further management pending results []  will likely need to perform EUA with hysteroscopy, D&C   No orders of the defined types were placed in this encounter.   Return for pelvic US and appt with Reinette Cuneo after.   Myna Hidalgo, DO Attending Obstetrician & Gynecologist, [facility] for [employer] [employer] [facility]

## 2023-02-25 ENCOUNTER — Encounter (HOSPITAL_COMMUNITY): Payer: Self-pay | Admitting: Speech Pathology

## 2023-02-25 ENCOUNTER — Ambulatory Visit (HOSPITAL_COMMUNITY): Payer: 59 | Attending: Gastroenterology | Admitting: Speech Pathology

## 2023-02-25 ENCOUNTER — Ambulatory Visit (HOSPITAL_COMMUNITY)
Admission: RE | Admit: 2023-02-25 | Discharge: 2023-02-25 | Disposition: A | Discharge status: Home / Self Care | Payer: 59 | Source: Ambulatory Visit | Attending: Gastroenterology | Admitting: Gastroenterology

## 2023-02-25 DIAGNOSIS — K219 Gastro-esophageal reflux disease without esophagitis: Secondary | ICD-10-CM | POA: Insufficient documentation

## 2023-02-25 DIAGNOSIS — R1312 Dysphagia, oropharyngeal phase: Secondary | ICD-10-CM | POA: Diagnosis present

## 2023-02-25 DIAGNOSIS — R1310 Dysphagia, unspecified: Secondary | ICD-10-CM | POA: Diagnosis not present

## 2023-02-25 NOTE — Therapy (Signed)
Modified Barium Swallow Study  Patient Details  Name: Margaret Bishop MRN: [id number] Date of Birth: [birth year]  Today's Date: 02/25/2023  HPI/PMH: HPI: Mirha Brucato is a 85 female who was referred for MBSS by Letta Median, PA-C. Pt with Intermittent vague sensation of items getting hung in the left side of her throat.  EGD October 2023 for the same symptoms showed normal esophagus. Taking omeprazole 40 mg daily which is keeping GERD well-controlled.   BPE: 12/26/2022 12:07 Occasional sensation of something getting hung in the left side of her throat. Does better if she eats small bites. Not frequent. IMPRESSION: 1. Mild esophageal dysmotility. 2. Mild gastroesophageal reflux. 3. Elevated left hemidiaphragm with colonic interposition.   Clinical Impression: Clinical Impression: MBSS completed. Pt presents with mild oropharyngeal dysphagia characterized by disorganized lingual movement, piecemeal deglutition, min delay in swallow trigger at the level of the valleculae, minimally reduced hyolaryngeal excursion resulting in trace lingual residuals post swallow which trickle to valleculae and then are swallowed with spontaneous second swallow, flash penetration of solids (small amount dipped into laryngeal vestibule upon swallow trigger and were expelled), and one episode of small amount of solid residuals in left lateral channel which were cleared with spontaneous second swallow. This was only observed on one swallow or could be consistent with Pt's reports of "sticking on the left side". Esophageal sweep revealed delayed emptying and elevated left hemidiaphragm with question of air in the color per radiologist. Recommend regular textures and thin liquids with standard aspiration and reflux precautions. Pt encouraged to take small bites of solids and masticate well and swallow 2x for each bite/sip. Pt may wish to pursue ENT consult I symptoms worsen or she develops pain with swallow. She appeared  to have large pyriform sinuses, but otherwise WNL. No further SLP services indicated at this time. Pt has my contact information should she have further questions or concerns.   Factors that may increase risk of adverse event in presence of aspiration Rubye Oaks & Clearance Coots 2021): No data recorded  Recommendations/Plan: Swallowing Evaluation Recommendations Swallowing Evaluation Recommendations Recommendations: PO diet PO Diet Recommendation: Regular; Thin liquids (Level 0) Liquid Administration via: Cup; Straw Medication Administration: Whole meds with liquid Supervision: Patient able to self-feed Swallowing strategies  : Small bites/sips; Multiple dry swallows after each bite/sip Postural changes: Position pt fully upright for meals; Stay upright 30-60 min after meals; Out of bed for meals Oral care recommendations: Oral care BID (2x/day) Recommended consults: Consider ENT consultation (If symptoms persist, worsen, or she experiences pain with swallow)    Treatment Plan Treatment Plan Treatment recommendations: No treatment recommended at this time Follow-up recommendations: No SLP follow up     Recommendations Recommendations for follow up therapy are one component of a multi-disciplinary discharge planning process, led by the attending physician.  Recommendations may be updated based on patient status, additional functional criteria and insurance authorization.  Assessment: Orofacial Exam: Orofacial Exam Oral Cavity: Oral Hygiene: WFL Oral Cavity - Dentition: Other (Comment) (U/L partials) Orofacial Anatomy: WFL Oral Motor/Sensory Function: WFL    Anatomy:  Anatomy: WFL   Boluses Administered: Boluses Administered Boluses Administered: Thin liquids (Level 0); Mildly thick liquids (Level 2, nectar thick); Puree; Solid     Oral Impairment Domain: Oral Impairment Domain Lip Closure: No labial escape Tongue control during bolus hold: Escape to lateral buccal  cavity/floor of mouth Bolus preparation/mastication: Slow prolonged chewing/mashing with complete recollection Bolus transport/lingual motion: Delayed initiation of tongue motion (oral holding) Oral residue: Trace residue lining  oral structures Location of oral residue : Tongue Initiation of pharyngeal swallow : Valleculae     Pharyngeal Impairment Domain: Pharyngeal Impairment Domain Soft palate elevation: No bolus between soft palate (SP)/pharyngeal wall (PW) Laryngeal elevation: Partial superior movement of thyroid cartilage/partial approximation of arytenoids to epiglottic petiole Anterior hyoid excursion: Partial anterior movement Epiglottic movement: Complete inversion Laryngeal vestibule closure: Incomplete, narrow column air/contrast in laryngeal vestibule Pharyngeal stripping wave : Present - complete Pharyngeal contraction (A/P view only): Complete Pharyngoesophageal segment opening: Complete distension and complete duration, no obstruction of flow Tongue base retraction: Trace column of contrast or air between tongue base and PPW Pharyngeal residue: Trace residue within or on pharyngeal structures Location of pharyngeal residue: Pyriform sinuses     Esophageal Impairment Domain: Esophageal Impairment Domain Esophageal clearance upright position: Esophageal retention    Pill: Pill Consistency administered: Thin liquids (Level 0) Thin liquids (Level 0): [facility]    Penetration/Aspiration Scale Score: Penetration/Aspiration Scale Score 1.  Material does not enter airway: Thin liquids (Level 0); Mildly thick liquids (Level 2, nectar thick); Puree; Pill 2.  Material enters airway, remains ABOVE vocal cords then ejected out: Solid    Compensatory Strategies: Compensatory Strategies Compensatory strategies: Yes Multiple swallows: Effective Effective Multiple Swallows: Solid       General Information: Caregiver present: No   Diet Prior to this Study: Regular;  Thin liquids (Level 0)    Temperature : Normal    Respiratory Status: WFL    Supplemental O2: None (Room air)    History of Recent Intubation: No   Behavior/Cognition: Alert; Cooperative; Pleasant mood  Self-Feeding Abilities: Able to self-feed  Baseline vocal quality/speech: Normal  Volitional Cough: Able to elicit  Volitional Swallow: Able to elicit  Exam Limitations: No limitations   Pain: Pain Assessment Pain Assessment: No/denies pain    End of Session: Start Time:No data recorded Stop Time: No data recorded Time Calculation:No data recorded Charges: No data recorded SLP visit diagnosis: SLP Visit Diagnosis: Dysphagia, oropharyngeal phase (R13.12)    Past Medical History:  Past Medical History:  Diagnosis Date   Abdominal hernia    Anxiety    Arthritis    Asthma    Bipolar disorder (HCC)    GERD (gastroesophageal reflux disease)    History of kidney stones    Hx of blood clots    Left leg   Hypertension    Shortness of breath dyspnea    with exertion   Past Surgical History:  Past Surgical History:  Procedure Laterality Date   BIOPSY  11/20/2021   Procedure: BIOPSY;  Surgeon: Lanelle Bal, DO;  [facility];  Service: Endoscopy;;   CATARACT EXTRACTION W/PHACO Left 08/10/2019   Procedure: CATARACT EXTRACTION PHACO AND INTRAOCULAR LENS PLACEMENT LEFT EYE;  Surgeon: Fabio Pierce, MD;  Location: [facility];  Service: Ophthalmology;  Laterality: Left;  CDE: 6.03   CATARACT EXTRACTION W/PHACO Right 08/24/2019   Procedure: CATARACT EXTRACTION PHACO AND INTRAOCULAR LENS PLACEMENT (IOC);  Surgeon: Fabio Pierce, MD;  Location: [facility];  Service: Ophthalmology;  Laterality: Right;  CDE: 5.18   CHOLECYSTECTOMY     COLONOSCOPY N/A 06/20/2015   Surgeon: West Bali, MD; 3 tubular adenomas, 1 hyperplastic polyp removed, significant looping of the colon.  Recommended next colonoscopy in 1-3 months at [facility] with fluoroscopy and overdue.  Would  not attempt colonoscopy at [facility] unless using an overtube and patient lost 40 pounds.   COLONOSCOPY  07/2015   [facility]   ESOPHAGOGASTRODUODENOSCOPY (EGD)  WITH PROPOFOL N/A 11/20/2021   Procedure: ESOPHAGOGASTRODUODENOSCOPY (EGD) WITH PROPOFOL;  Surgeon: Lanelle Bal, DO;  [facility];  Service: Endoscopy;  Laterality: N/A;  7:30am, asa 3   INSERTION OF MESH N/A 11/30/2014   Procedure: INSERTION OF MESH;  Surgeon: Axel Filler, MD;  Location: [facility] OR;  Service: General;  Laterality: N/A;   KNEE ARTHROPLASTY Left    UMBILICAL HERNIA REPAIR N/A 11/30/2014   Procedure: LAPAROSCOPIC UMBILICAL HERNIA REPAIR WITH MESH;  Surgeon: Axel Filler, MD;  Location: [facility] OR;  Service: General;  Laterality: N/A;   Thank you,  Havery Moros, CCC-SLP [phone]  Kourosh Jablonsky 02/25/2023, 2:54 PM

## 2023-02-28 ENCOUNTER — Encounter: Payer: Self-pay | Admitting: Obstetrics & Gynecology

## 2023-02-28 ENCOUNTER — Ambulatory Visit: Payer: 59

## 2023-02-28 ENCOUNTER — Ambulatory Visit: Payer: 59 | Admitting: Obstetrics & Gynecology

## 2023-02-28 VITALS — BP 136/88 | HR 67

## 2023-02-28 DIAGNOSIS — N950 Postmenopausal bleeding: Secondary | ICD-10-CM | POA: Diagnosis not present

## 2023-02-28 DIAGNOSIS — R9389 Abnormal findings on diagnostic imaging of other specified body structures: Secondary | ICD-10-CM | POA: Diagnosis not present

## 2023-02-28 NOTE — Progress Notes (Signed)
GYN VISIT Patient name: Margaret Bishop MRN [id number]  Date of birth: [birth year] Chief Complaint:   Follow-up  History of Present Illness:   Margaret Bishop is a 73 y.o. PM female being seen today for follow up regarding:  PMB- In review, all at once had a "period" that started around Nov-December. Bleeding will last for about 3 days- using Depends and pads changing about 3x per day. Denies BRB. Bleeding has occurred about every week.   EMB was attempted in office on June 17;however, patient was unable to tolerate procedure and EMB was not completed  She presents today for [place]: uterus with multiple fibroids,(#1) anterior intramural fibroid 2.2 x 1.8 x 1.8 cm,(#2) right intramural mid 2.4 x 1.9 x 1.6 cm,(#3) posterior left subserosal fibroid 2.2 x 1.9 x 2.4 cm,complex thickened avascular endometrium 12.6 mm,normal ovaries,no free fluid,no pain during ultrasound \  No changes since her last visit  No LMP recorded. Patient is postmenopausal.    Review of Systems:   Pertinent items are noted in HPI Denies fever/chills, dizziness, headaches, visual disturbances, fatigue, shortness of breath, chest pain, abdominal pain, vomiting Pertinent History Reviewed:   Past Surgical History:  Procedure Laterality Date   BIOPSY  11/20/2021   Procedure: BIOPSY;  Surgeon: Lanelle Bal, DO;  [facility];  Service: Endoscopy;;   CATARACT EXTRACTION W/PHACO Left 08/10/2019   Procedure: CATARACT EXTRACTION PHACO AND INTRAOCULAR LENS PLACEMENT LEFT EYE;  Surgeon: Fabio Pierce, MD;  Location: [facility];  Service: Ophthalmology;  Laterality: Left;  CDE: 6.03   CATARACT EXTRACTION W/PHACO Right 08/24/2019   Procedure: CATARACT EXTRACTION PHACO AND INTRAOCULAR LENS PLACEMENT (IOC);  Surgeon: Fabio Pierce, MD;  Location: [facility];  Service: Ophthalmology;  Laterality: Right;  CDE: 5.18   CHOLECYSTECTOMY     COLONOSCOPY N/A 06/20/2015   Surgeon: West Bali, MD; 3 tubular adenomas, 1  hyperplastic polyp removed, significant looping of the colon.  Recommended next colonoscopy in 1-3 months at [facility] with fluoroscopy and overdue.  Would not attempt colonoscopy at [facility] unless using an overtube and patient lost 40 pounds.   COLONOSCOPY  07/2015   [facility]   ESOPHAGOGASTRODUODENOSCOPY (EGD) WITH PROPOFOL N/A 11/20/2021   Procedure: ESOPHAGOGASTRODUODENOSCOPY (EGD) WITH PROPOFOL;  Surgeon: Lanelle Bal, DO;  [facility];  Service: Endoscopy;  Laterality: N/A;  7:30am, asa 3   INSERTION OF MESH N/A 11/30/2014   Procedure: INSERTION OF MESH;  Surgeon: Axel Filler, MD;  Location: [facility];  Service: General;  Laterality: N/A;   KNEE ARTHROPLASTY Left    UMBILICAL HERNIA REPAIR N/A 11/30/2014   Procedure: LAPAROSCOPIC UMBILICAL HERNIA REPAIR WITH MESH;  Surgeon: Axel Filler, MD;  Location: [facility];  Service: General;  Laterality: N/A;    Past Medical History:  Diagnosis Date   Abdominal hernia    Anxiety    Arthritis    Asthma    Bipolar disorder (HCC)    GERD (gastroesophageal reflux disease)    History of kidney stones    Hx of blood clots    Left leg   Hypertension    Shortness of breath dyspnea    with exertion   Reviewed problem list, medications and allergies. Physical Assessment:   Vitals:   02/28/23 1145  BP: 136/88  Pulse: 67  There is no height or weight on file to calculate BMI.       Physical Examination:   General appearance: alert, well appearing, and in no distress  Psych:  mood appropriate, normal affect  Skin: warm & dry   Cardiovascular: normal heart rate noted  Respiratory: normal respiratory effort, no distress  Abdomen: soft, non-tender   Pelvic: examination not indicated  Extremities: no edema   Chaperone: N/A    Assessment & Plan:  1) PMB, Thickened endometrium -Reviewed today's ultrasound report -In light of PMB and thickened endometrium advised endometrial sampling.  Due to inability to tolerate EMB  recommendation to proceed with hysteroscopy, D&C and possible polypectomy -Reviewed risk, benefits of surgery including but not limited to risk of bleeding, infection and injury due to uterine perforation.  Discussed that should a uterine perforation occur would plan for diagnostic laparoscopy -Discussed same-day procedure and reviewed expected recovery -Questions and concerns were addressed and patient desires to proceed with next available -medications reviewed, no GLP-1 or anticoagulant  -Message sent for Feb 11   No orders of the defined types were placed in this encounter.   Return for TBD- plan for surgery Feb 4.   Myna Hidalgo, DO Attending Obstetrician & Gynecologist, [facility] for [employer], [facility]

## 2023-02-28 NOTE — Progress Notes (Signed)
PELVIC US TA/TV: heterogeneous anteverted uterus with multiple fibroids,(#1) anterior intramural fibroid 2.2 x 1.8 x 1.8 cm,(#2) right intramural mid  2.4 x 1.9 x 1.6 cm,(#3) posterior left subserosal fibroid 2.2 x 1.9 x 2.4 cm,complex thickened avascular endometrium 12.6 mm,normal ovaries,no free fluid,no pain during ultrasound  Chaperone Kelis

## 2023-03-01 NOTE — Patient Instructions (Addendum)
Your procedure is scheduled on: 03/05/2023  Report to [facility] Main Entrance at  8:50   AM.  Call this number if you have problems the morning of surgery: [phone]   Remember:   Do not Eat after midnight, You may have CLEAR liquids until 7:30 am water, tea, soft drinks, and/or  black coffee NO CREAM        No Smoking the morning of surgery  Hold Eliquis as instructed by Dr Charlotta Newton  :  Take these medicines the morning of surgery with A SIP OF WATER: Amlodipine, Lamictal, omeprazole or pantoprazole, and lithium   Do not wear jewelry, make-up or nail polish.  Do not wear lotions, powders, or perfumes. You may wear deodorant.  Do not shave 48 hours prior to surgery. Men may shave face and neck.  Do not bring valuables to the hospital.  Contacts, dentures or bridgework may not be worn into surgery.  Leave suitcase in the car. After surgery it may be brought to your room.  For patients admitted to the hospital, checkout time is 11:00 AM the day of discharge.   Patients discharged the day of surgery will not be allowed to drive home.    Special Instructions: Shower using CHG night before surgery and shower the day of surgery use CHG.  Use special wash - you have one bottle of CHG for all showers.  You should use approximately 1/2 of the bottle for each shower. How to Use Chlorhexidine at Home in the Shower Chlorhexidine gluconate (CHG) is a germ-killing (antiseptic) wash that's used to clean the skin. It can get rid of the germs that normally live on the skin and can keep them away for about 24 hours. If you're having surgery, you may be told to shower with CHG at home the night before surgery. This can help lower your risk for infection. To use CHG wash in the shower, follow the steps below. Supplies needed: CHG body wash. Clean washcloth. Clean towel. How to use CHG in the shower Follow these steps unless you're told to use CHG in a different way: Start the shower. Use your normal  soap and shampoo to wash your face and hair. Turn off the shower or move out of the shower stream. Pour CHG onto a clean washcloth. Do not use any type of brush or rough sponge. Start at your neck, washing your body down to your toes. Make sure you: Wash the part of your body where the surgery will be done for at least 1 minute. Do not scrub. Do not use CHG on your head or face unless your health care provider tells you to. If it gets into your ears or eyes, rinse them well with water. Do not wash your genitals with CHG. Wash your back and under your arms. Make sure to wash skin folds. Let the CHG sit on your skin for 1-2 minutes or as long as told. Rinse your entire body in the shower, including all body creases and folds. Turn off the shower. Dry off with a clean towel. Do not put anything on your skin afterward, such as powder, lotion, or perfume. Put on clean clothes or pajamas. If it's the night before surgery, sleep in clean sheets. General tips Use CHG only as told, and follow the instructions on the label. Use the full amount of CHG as told. This is often one bottle. Do not smoke and stay away from flames after using CHG. Your skin may feel sticky  after using CHG. This is normal. The sticky feeling will go away as the CHG dries. Do not use CHG: If you have a chlorhexidine allergy or have reacted to chlorhexidine in the past. On open wounds or areas of skin that have broken skin, cuts, or scrapes. On babies younger than 65 months of age. Contact a health care provider if: You have questions about using CHG. Your skin gets irritated or itchy. You have a rash after using CHG. You swallow any CHG. Call your local poison control center [phone] in the [place] Your eyes itch badly, or they become very red or swollen. Your hearing changes. You have trouble seeing. If you can't reach your provider, go to an urgent care or emergency room. Do not drive yourself. Get help right away  if: You have swelling or tingling in your mouth or throat. You make high-pitched whistling sounds when you breathe, most often when you breathe out (wheeze). You have trouble breathing. These symptoms may be an emergency. Call 911 right away. Do not wait to see if the symptoms will go away. Do not drive yourself to the hospital. This information is not intended to replace advice given to you by your health care provider. Make sure you discuss any questions you have with your health care provider. Document Revised: 07/31/2022 Document Reviewed: 07/27/2021 Elsevier Patient Education  2024 Elsevier Inc.  Dilation and Curettage, Care After The following information offers guidance on how to care for yourself after your procedure. Your doctor may also give you more specific instructions. If you have problems or questions, contact your doctor. What can I expect after the procedure? After the procedure, it is common to have: Mild pain or cramps. Some bleeding or spotting from the vagina. These may last for up to 2 weeks. Follow these instructions at home: Medicines Take over-the-counter and prescription medicines only as told by your doctor. If told, take steps to prevent problems with pooping (constipation). You may need to: Drink enough fluid to keep your pee (urine) pale yellow. Take medicines. You will be told what medicines to take. Eat foods that are high in fiber. These include beans, whole grains, and fresh fruits and vegetables. Limit foods that are high in fat and sugar. These include fried or sweet foods. Ask your doctor if you should avoid driving or using machines while you are taking your medicine. Activity  If you were given a medicine to help you relax (sedative) during your procedure, it can affect you for many hours. Do not drive or use machinery until your doctor says that it is safe. Rest as told by your doctor. Get up to take short walks every 1-2 hours. Ask for help if  you feel weak or unsteady. Do not lift anything that is heavier than 10 lb (4.5 kg), or the limit that you are told. Return to your normal activities when your doctor says that it is safe. Lifestyle For at least 2 weeks, or as long as told by your doctor: Do not douche. Do not use tampons. Do not have sex. General instructions Do not take baths, swim, or use a hot tub. Ask your doctor if you may take showers. Do not smoke or use any products that contain nicotine or tobacco. These can delay healing. If you need help quitting, ask your doctor. Wear compression stockings as told by your doctor. It is up to you to get the results of your procedure. Ask how to get your results when they are  ready. Keep all follow-up visits. Contact a doctor if: You have very bad cramps that get worse or do not get better with medicine. You have very bad pain in your belly (abdomen). You cannot drink fluids without vomiting. You have pain in the area just above your thighs. You have fluid from your vagina that smells bad. You have a rash. Get help right away if: You are bleeding a lot from your vagina. This means soaking more than one sanitary pad in 1 hour, and this happens for 2 hours in a row. You have a fever that is above 100.51F (38C). Your belly feels very tender or hard. You have chest pain. You have trouble breathing. You feel dizzy or light-headed. You faint. You have pain in your neck or shoulder area. These symptoms may be an emergency. Get help right away. Call your local emergency services (911 in the [place] Do not wait to see if the symptoms will go away. Do not drive yourself to the hospital. Summary After your procedure, it is common to have pain or cramping. It is also common to have bleeding or spotting from your vagina. Rest as told. Get up to take short walks every 1-2 hours. Do not lift anything that is heavier than 10 lb (4.5 kg), or the limit that you are told. Get help right  away if you have problems from the procedure. Ask your doctor what problems to watch for. This information is not intended to replace advice given to you by your health care provider. Make sure you discuss any questions you have with your health care provider. Document Revised: 01/04/2020 Document Reviewed: 01/06/2020 Elsevier Patient Education  2024 Elsevier Inc. General Anesthesia, Adult, Care After The following information offers guidance on how to care for yourself after your procedure. Your health care provider may also give you more specific instructions. If you have problems or questions, contact your health care provider. What can I expect after the procedure? After the procedure, it is common for people to: Have pain or discomfort at the IV site. Have nausea or vomiting. Have a sore throat or hoarseness. Have trouble concentrating. Feel cold or chills. Feel weak, sleepy, or tired (fatigue). Have soreness and body aches. These can affect parts of the body that were not involved in surgery. Follow these instructions at home: For the time period you were told by your health care provider:  Rest. Do not participate in activities where you could fall or become injured. Do not drive or use machinery. Do not drink alcohol. Do not take sleeping pills or medicines that cause drowsiness. Do not make important decisions or sign legal documents. Do not take care of children on your own. General instructions Drink enough fluid to keep your urine pale yellow. If you have sleep apnea, surgery and certain medicines can increase your risk for breathing problems. Follow instructions from your health care provider about wearing your sleep device: Anytime you are sleeping, including during daytime naps. While taking prescription pain medicines, sleeping medicines, or medicines that make you drowsy. Return to your normal activities as told by your health care provider. Ask your health care  provider what activities are safe for you. Take over-the-counter and prescription medicines only as told by your health care provider. Do not use any products that contain nicotine or tobacco. These products include cigarettes, chewing tobacco, and vaping devices, such as e-cigarettes. These can delay incision healing after surgery. If you need help quitting, ask your health care provider.  Contact a health care provider if: You have nausea or vomiting that does not get better with medicine. You vomit every time you eat or drink. You have pain that does not get better with medicine. You cannot urinate or have bloody urine. You develop a skin rash. You have a fever. Get help right away if: You have trouble breathing. You have chest pain. You vomit blood. These symptoms may be an emergency. Get help right away. Call 911. Do not wait to see if the symptoms will go away. Do not drive yourself to the hospital. Summary After the procedure, it is common to have a sore throat, hoarseness, nausea, vomiting, or to feel weak, sleepy, or fatigue. For the time period you were told by your health care provider, do not drive or use machinery. Get help right away if you have difficulty breathing, have chest pain, or vomit blood. These symptoms may be an emergency. This information is not intended to replace advice given to you by your health care provider. Make sure you discuss any questions you have with your health care provider. Document Revised: 04/14/2021 Document Reviewed: 04/14/2021 Elsevier Patient Education  2024 [employer].

## 2023-03-04 ENCOUNTER — Encounter: Payer: Self-pay | Admitting: Obstetrics & Gynecology

## 2023-03-04 ENCOUNTER — Encounter (HOSPITAL_COMMUNITY)
Admission: RE | Admit: 2023-03-04 | Discharge: 2023-03-04 | Disposition: A | Discharge status: Home / Self Care | Payer: 59 | Source: Ambulatory Visit | Attending: Obstetrics & Gynecology | Admitting: Obstetrics & Gynecology

## 2023-03-04 ENCOUNTER — Encounter (HOSPITAL_COMMUNITY): Payer: Self-pay

## 2023-03-04 VITALS — BP 136/88 | HR 67 | Temp 97.7°F | Resp 18 | Ht 67.5 in | Wt 219.6 lb

## 2023-03-04 DIAGNOSIS — Z01818 Encounter for other preprocedural examination: Secondary | ICD-10-CM | POA: Diagnosis present

## 2023-03-04 DIAGNOSIS — I10 Essential (primary) hypertension: Secondary | ICD-10-CM | POA: Insufficient documentation

## 2023-03-04 DIAGNOSIS — R9389 Abnormal findings on diagnostic imaging of other specified body structures: Secondary | ICD-10-CM | POA: Insufficient documentation

## 2023-03-04 LAB — CBC
HCT: 37.4 % (ref 36.0–46.0)
Hemoglobin: 11.2 g/dL — ABNORMAL LOW (ref 12.0–15.0)
MCH: 29.5 pg (ref 26.0–34.0)
MCHC: 29.9 g/dL — ABNORMAL LOW (ref 30.0–36.0)
MCV: 98.4 fL (ref 80.0–100.0)
Platelets: 223 10*3/uL (ref 150–400)
RBC: 3.8 MIL/uL — ABNORMAL LOW (ref 3.87–5.11)
RDW: 12.4 % (ref 11.5–15.5)
WBC: 4.4 10*3/uL (ref 4.0–10.5)
nRBC: 0 % (ref 0.0–0.2)

## 2023-03-04 NOTE — Progress Notes (Signed)
Pre-op instructions given; patient was told to hold Eliquis per Dr. Lawana Chambers orders.  Patient was unaware of holding her Eliquis dose.  Called office and spoke with Dr. Charlotta Newton.  She suggested patient refrain from taking Eliquis the morning of her procedure on 03/05/23.  Informed patient of this order.  Patient verbalized understading.

## 2023-03-04 NOTE — H&P (Signed)
 [facility] Attending History and Physical  Margaret Bishop is a 73 y.o. PM female who presents for scheduled hysteroscopy, D&C, potential intervention due to postmenopausal bleeding and thickened endometrium   In review, patient reports that bleeding started around November or December.  Bleeding lasted for approximately 3 days.  She continues to note intermittent bleeding about every week or so.  An in office EMB was attempted and unable to complete.  A follow-up ultrasound showed a thickened lining (see below)  Denies any abnormal vaginal discharge, fevers, chills, sweats, dysuria, nausea, vomiting, other GI or GU symptoms or other general symptoms.  Reports no acute complaints or concerns.  Past Medical History:  Diagnosis Date   Abdominal hernia    Anxiety    Arthritis    Asthma    Bipolar disorder (HCC)    GERD (gastroesophageal reflux disease)    History of kidney stones    Hx of blood clots    Left leg   Hypertension    Shortness of breath dyspnea    with exertion   Past Surgical History:  Procedure Laterality Date   BIOPSY  11/20/2021   Procedure: BIOPSY;  Surgeon: Cindie Carlin POUR, DO;  Location: [facility];  Service: Endoscopy;;   CATARACT EXTRACTION W/PHACO Left 08/10/2019   Procedure: CATARACT EXTRACTION PHACO AND INTRAOCULAR LENS PLACEMENT LEFT EYE;  Surgeon: Harrie Agent, MD;  Location: [facility] ORS;  Service: Ophthalmology;  Laterality: Left;  CDE: 6.03   CATARACT EXTRACTION W/PHACO Right 08/24/2019   Procedure: CATARACT EXTRACTION PHACO AND INTRAOCULAR LENS PLACEMENT (IOC);  Surgeon: Harrie Agent, MD;  Location: [facility] ORS;  Service: Ophthalmology;  Laterality: Right;  CDE: 5.18   CHOLECYSTECTOMY     COLONOSCOPY N/A 06/20/2015   Surgeon: Margo LITTIE Haddock, MD; 3 tubular adenomas, 1 hyperplastic polyp removed, significant looping of the colon.  Recommended next colonoscopy in 1-3 months at [facility] with fluoroscopy and overdue.  Would not  attempt colonoscopy at [facility] unless using an overtube and patient lost 40 pounds.   COLONOSCOPY  07/2015   [facility]   ESOPHAGOGASTRODUODENOSCOPY (EGD) WITH PROPOFOL  N/A 11/20/2021   Procedure: ESOPHAGOGASTRODUODENOSCOPY (EGD) WITH PROPOFOL ;  Surgeon: Cindie Carlin POUR, DO;  Location: [facility];  Service: Endoscopy;  Laterality: N/A;  7:30am, asa 3   INSERTION OF MESH N/A 11/30/2014   Procedure: INSERTION OF MESH;  Surgeon: Lynda Leos, MD;  Location: [facility] OR;  Service: General;  Laterality: N/A;   KNEE ARTHROPLASTY Left    UMBILICAL HERNIA REPAIR N/A 11/30/2014   Procedure: LAPAROSCOPIC UMBILICAL HERNIA REPAIR WITH MESH;  Surgeon: Lynda Leos, MD;  Location: [facility] OR;  Service: General;  Laterality: N/A;   OB History  No obstetric history on file.  Patient denies any other pertinent gynecologic issues.  No current facility-administered medications on file prior to encounter.   Current Outpatient Medications on File Prior to Encounter  Medication Sig Dispense Refill   acetaminophen  (TYLENOL ) 500 MG tablet Take 500 mg by mouth every 6 (six) hours as needed (pain.).     albuterol (VENTOLIN HFA) 108 (90 Base) MCG/ACT inhaler Inhale 1 puff into the lungs every 6 (six) hours as needed for shortness of breath or wheezing.     amLODipine -olmesartan  (AZOR ) 5-40 MG per tablet Take by mouth in the morning.     Cholecalciferol (VITAMIN D-3 PO) Take 1 tablet by mouth in the morning.     ELIQUIS 2.5 MG TABS tablet Take 2.5 mg by mouth 2 (two) times  daily.     lamoTRIgine  (LAMICTAL ) 25 MG tablet Take 25 mg by mouth in the morning.     lithium  carbonate 300 MG capsule Take 300 mg by mouth in the morning.     omeprazole  (PRILOSEC) 40 MG capsule Take 1 capsule (40 mg total) by mouth daily before breakfast. 30 capsule 5   pantoprazole  (PROTONIX ) 40 MG tablet Take 40 mg by mouth daily.     simvastatin (ZOCOR) 20 MG tablet Take 20 mg by mouth every evening.     vitamin B-12 (CYANOCOBALAMIN ) 500 MCG  tablet Take 500 mcg by mouth in the morning.     Allergies  Allergen Reactions   Onion Hives and Other (See Comments)    HEARTBURN    Shellfish Allergy Swelling   Strawberry Extract Hives   Tomato Hives    Social History:   reports that he has never smoked. He has never used smokeless tobacco. He reports that he does not drink alcohol and does not use drugs. Family History  Problem Relation Age of Onset   Colon cancer Neg Hx    Esophageal cancer Neg Hx     Review of Systems: Pertinent items noted in HPI and remainder of comprehensive ROS otherwise negative.  PHYSICAL EXAM: There were no vitals taken for this visit. CONSTITUTIONAL: Well-developed, well-nourished female in no acute distress.  SKIN: Skin is warm and dry. No rash noted. Not diaphoretic. No erythema. No pallor. NEUROLOGIC: Alert and oriented to person, place, and time. Normal reflexes, muscle tone coordination. No cranial nerve deficit noted. PSYCHIATRIC: Normal mood and affect. Normal behavior. Normal judgment and thought content. CARDIOVASCULAR: Normal heart rate noted, regular rhythm RESPIRATORY: Effort and breath sounds normal, no problems with respiration noted ABDOMEN: Soft, nontender, nondistended. PELVIC: deferred MUSCULOSKELETAL: no calf tenderness bilaterally EXT: no edema bilaterally, normal pulses  Labs: No results found for this or any previous visit (from the past 2 weeks).  Imaging Studies: US : 01/2023:  Uterus- 6 x 4 x 4.5 cm, Total uterine volume 58 cc,heterogeneous anteverted uterus with multiple fibroids,(#1) anterior intramural fibroid 2.2 x 1.8 x 1.8 cm,(#2) right intramural mid  2.4 x 1.9 x 1.6 cm,(#3) posterior left subserosal fibroid 2.2 x 1.9 x 2.4 cm  Endometrium 12.6 mm Normal ovaries bilaterally  Assessment: Postmenopausal bleeding Thickened endometrium  Plan: Hysteroscopy, D&C, further intervention -NPO -LR @ 125cc/hr -SCDs to OR -Risk/benefits and alternatives reviewed with  the patient including but not limited to risk of bleeding, infection and injury to surrounding organs due to uterine perforation requiring further surgical intervention.  Questions and concerns were addressed and pt desires to proceed  Abdifatah Colquhoun, DO Attending Obstetrician & Gynecologist, [facility] for [facility], [facility]

## 2023-03-05 ENCOUNTER — Encounter (HOSPITAL_COMMUNITY)
Admission: RE | Disposition: A | Discharge status: Home / Self Care | Payer: Self-pay | Source: Home / Self Care | Attending: Obstetrics & Gynecology

## 2023-03-05 ENCOUNTER — Ambulatory Visit (HOSPITAL_COMMUNITY)
Admission: RE | Admit: 2023-03-05 | Discharge: 2023-03-05 | Disposition: A | Discharge status: Home / Self Care | Payer: 59 | Attending: Obstetrics & Gynecology | Admitting: Obstetrics & Gynecology

## 2023-03-05 ENCOUNTER — Encounter (HOSPITAL_COMMUNITY): Payer: Self-pay | Admitting: Obstetrics & Gynecology

## 2023-03-05 ENCOUNTER — Ambulatory Visit (HOSPITAL_COMMUNITY): Payer: 59 | Admitting: Anesthesiology

## 2023-03-05 ENCOUNTER — Ambulatory Visit (HOSPITAL_BASED_OUTPATIENT_CLINIC_OR_DEPARTMENT_OTHER): Payer: 59 | Admitting: Anesthesiology

## 2023-03-05 DIAGNOSIS — I10 Essential (primary) hypertension: Secondary | ICD-10-CM | POA: Diagnosis not present

## 2023-03-05 DIAGNOSIS — F319 Bipolar disorder, unspecified: Secondary | ICD-10-CM | POA: Insufficient documentation

## 2023-03-05 DIAGNOSIS — J45909 Unspecified asthma, uncomplicated: Secondary | ICD-10-CM

## 2023-03-05 DIAGNOSIS — K219 Gastro-esophageal reflux disease without esophagitis: Secondary | ICD-10-CM | POA: Diagnosis not present

## 2023-03-05 DIAGNOSIS — R9389 Abnormal findings on diagnostic imaging of other specified body structures: Secondary | ICD-10-CM

## 2023-03-05 DIAGNOSIS — N840 Polyp of corpus uteri: Secondary | ICD-10-CM | POA: Diagnosis not present

## 2023-03-05 DIAGNOSIS — Z79899 Other long term (current) drug therapy: Secondary | ICD-10-CM | POA: Insufficient documentation

## 2023-03-05 DIAGNOSIS — N950 Postmenopausal bleeding: Secondary | ICD-10-CM

## 2023-03-05 HISTORY — PX: HYSTEROSCOPY WITH D & C: SHX1775

## 2023-03-05 SURGERY — DILATATION AND CURETTAGE /HYSTEROSCOPY
Anesthesia: General | Site: Vagina

## 2023-03-05 MED ORDER — FENTANYL CITRATE (PF) 100 MCG/2ML IJ SOLN
INTRAMUSCULAR | Status: AC
  Filled 2023-03-05: qty 2

## 2023-03-05 MED ORDER — PHENYLEPHRINE 80 MCG/ML (10ML) SYRINGE FOR IV PUSH (FOR BLOOD PRESSURE SUPPORT)
PREFILLED_SYRINGE | INTRAVENOUS | Status: AC
  Filled 2023-03-05: qty 10

## 2023-03-05 MED ORDER — PROPOFOL 10 MG/ML IV BOLUS
INTRAVENOUS | Status: AC
  Filled 2023-03-05: qty 20

## 2023-03-05 MED ORDER — MIDAZOLAM HCL 2 MG/2ML IJ SOLN
INTRAMUSCULAR | Status: AC
Start: 2023-03-05 — End: ?
  Filled 2023-03-05: qty 2

## 2023-03-05 MED ORDER — LACTATED RINGERS IV SOLN
INTRAVENOUS | Status: AC
Start: 2023-03-05 — End: 2023-03-05

## 2023-03-05 MED ORDER — PROPOFOL 10 MG/ML IV BOLUS
INTRAVENOUS | Status: DC | PRN
  Administered 2023-03-05: 140 mg via INTRAVENOUS

## 2023-03-05 MED ORDER — CHLORHEXIDINE GLUCONATE 0.12 % MT SOLN: 15.0000 mL | Freq: Once | OROMUCOSAL | Status: DC

## 2023-03-05 MED ORDER — ONDANSETRON HCL 4 MG/2ML IJ SOLN
INTRAMUSCULAR | Status: AC
Start: 2023-03-05 — End: ?
  Filled 2023-03-05: qty 2

## 2023-03-05 MED ORDER — MIDAZOLAM HCL 2 MG/2ML IJ SOLN
INTRAMUSCULAR | Status: DC | PRN
  Administered 2023-03-05: 1 mg via INTRAVENOUS

## 2023-03-05 MED ORDER — PROMETHAZINE HCL 25 MG/ML IJ SOLN: 12.5000 mg | Freq: Once | INTRAMUSCULAR | Status: DC | PRN

## 2023-03-05 MED ORDER — SODIUM CHLORIDE 0.9% FLUSH: 3.0000 mL | INTRAVENOUS | Status: DC | PRN

## 2023-03-05 MED ORDER — ORAL CARE MOUTH RINSE: 15.0000 mL | Freq: Once | OROMUCOSAL | Status: DC

## 2023-03-05 MED ORDER — SODIUM CHLORIDE 0.9% FLUSH: 3.0000 mL | Freq: Two times a day (BID) | INTRAVENOUS | Status: DC

## 2023-03-05 MED ORDER — LACTATED RINGERS IV SOLN: INTRAVENOUS | Status: DC | PRN

## 2023-03-05 MED ORDER — FENTANYL CITRATE PF 50 MCG/ML IJ SOSY
25.0000 ug | PREFILLED_SYRINGE | INTRAMUSCULAR | Status: DC | PRN
Start: 2023-03-05 — End: 2023-03-05

## 2023-03-05 MED ORDER — DEXAMETHASONE SODIUM PHOSPHATE 10 MG/ML IJ SOLN
INTRAMUSCULAR | Status: DC | PRN
  Administered 2023-03-05: 10 mg via INTRAVENOUS

## 2023-03-05 MED ORDER — PHENYLEPHRINE 80 MCG/ML (10ML) SYRINGE FOR IV PUSH (FOR BLOOD PRESSURE SUPPORT)
PREFILLED_SYRINGE | INTRAVENOUS | Status: DC | PRN
  Administered 2023-03-05: 160 ug via INTRAVENOUS

## 2023-03-05 MED ORDER — LIDOCAINE-EPINEPHRINE 0.5 %-1:200000 IJ SOLN
INTRAMUSCULAR | Status: AC
  Filled 2023-03-05: qty 50

## 2023-03-05 MED ORDER — FENTANYL CITRATE (PF) 100 MCG/2ML IJ SOLN
INTRAMUSCULAR | Status: DC | PRN
  Administered 2023-03-05: 100 ug via INTRAVENOUS

## 2023-03-05 MED ORDER — LIDOCAINE-EPINEPHRINE 0.5 %-1:200000 IJ SOLN
INTRAMUSCULAR | Status: DC | PRN
  Administered 2023-03-05: 10 mL

## 2023-03-05 MED ORDER — SODIUM CHLORIDE 0.9 % IR SOLN
Status: DC | PRN
  Administered 2023-03-05: 1000 mL
  Administered 2023-03-05: 3000 mL

## 2023-03-05 MED ORDER — OXYCODONE HCL 5 MG/5ML PO SOLN: 5.0000 mg | Freq: Once | ORAL | Status: DC | PRN

## 2023-03-05 MED ORDER — ONDANSETRON HCL 4 MG/2ML IJ SOLN
INTRAMUSCULAR | Status: DC | PRN
  Administered 2023-03-05: 4 mg via INTRAVENOUS

## 2023-03-05 MED ORDER — OXYCODONE HCL 5 MG PO TABS: 5.0000 mg | ORAL_TABLET | Freq: Once | ORAL | Status: DC | PRN

## 2023-03-05 MED ORDER — DEXAMETHASONE SODIUM PHOSPHATE 10 MG/ML IJ SOLN
INTRAMUSCULAR | Status: AC
  Filled 2023-03-05: qty 1

## 2023-03-05 SURGICAL SUPPLY — 27 items
CLOTH BEACON ORANGE TIMEOUT ST (SAFETY) ×2 IMPLANT
COVER LIGHT HANDLE STERIS (MISCELLANEOUS) ×6 IMPLANT
DEVICE MYOSURE REACH (MISCELLANEOUS) IMPLANT
DILATOR CANAL MILEX (MISCELLANEOUS) IMPLANT
GAUZE 4X4 16PLY ~~LOC~~+RFID DBL (SPONGE) ×4 IMPLANT
GLOVE BIO SURGEON STRL SZ 6.5 (GLOVE) ×2 IMPLANT
GLOVE BIOGEL PI IND STRL 7.0 (GLOVE) ×6 IMPLANT
GOWN STRL REUS W/ TWL LRG LVL3 (GOWN DISPOSABLE) ×2 IMPLANT
GOWN STRL REUS W/TWL LRG LVL3 (GOWN DISPOSABLE) ×2 IMPLANT
IV NS IRRIG 3000ML ARTHROMATIC (IV SOLUTION) ×2 IMPLANT
KIT PROCEDURE FLUENT (KITS) ×2 IMPLANT
KIT TURNOVER CYSTO (KITS) ×2 IMPLANT
KIT TURNOVER KIT A (KITS) ×2 IMPLANT
NDL HYPO 18GX1.5 BLUNT FILL (NEEDLE) ×2 IMPLANT
NEEDLE HYPO 18GX1.5 BLUNT FILL (NEEDLE) ×1 IMPLANT
NS IRRIG 1000ML POUR BTL (IV SOLUTION) ×2 IMPLANT
PACK PERI GYN (CUSTOM PROCEDURE TRAY) ×2 IMPLANT
PAD ARMBOARD 7.5X6 YLW CONV (MISCELLANEOUS) ×2 IMPLANT
PAD TELFA 3X4 1S STER (GAUZE/BANDAGES/DRESSINGS) ×2 IMPLANT
POSITIONER HEAD 8X9X4 ADT (SOFTGOODS) ×2 IMPLANT
SEAL ROD LENS SCOPE MYOSURE (ABLATOR) ×2 IMPLANT
SET BASIN LINEN APH (SET/KITS/TRAYS/PACK) ×2 IMPLANT
SOL PREP POV-IOD 4OZ 10% (MISCELLANEOUS) ×2 IMPLANT
SYR 30ML LL (SYRINGE) ×2 IMPLANT
SYR CONTROL 10ML LL (SYRINGE) ×2 IMPLANT
TOWEL OR 17X26 4PK STRL BLUE (TOWEL DISPOSABLE) ×2 IMPLANT
UNDERPAD 30X36 HEAVY ABSORB (UNDERPADS AND DIAPERS) ×2 IMPLANT

## 2023-03-05 NOTE — Anesthesia Preprocedure Evaluation (Addendum)
Anesthesia Evaluation  Patient identified by MRN, date of birth, ID band Patient awake    Reviewed: Allergy & Precautions, H&P , NPO status , Patient's Chart, lab work & pertinent test results, reviewed documented beta blocker date and time   Airway Mallampati: II  TM Distance: >3 FB Neck ROM: full    Dental  (+) Dental Advisory Given, Missing Only one or two teeth still there on top and many, many missing teeth on bottom:   Pulmonary shortness of breath, asthma    Pulmonary exam normal breath sounds clear to auscultation       Cardiovascular Exercise Tolerance: Good hypertension, Normal cardiovascular exam Rhythm:regular Rate:Normal     Neuro/Psych  PSYCHIATRIC DISORDERS Anxiety  Bipolar Disorder   negative neurological ROS     GI/Hepatic Neg liver ROS,GERD  Medicated,,  Endo/Other  negative endocrine ROS    Renal/GU negative Renal ROS  negative genitourinary   Musculoskeletal  (+) Arthritis , Osteoarthritis,    Abdominal   Peds  Hematology negative hematology ROS (+)   Anesthesia Other Findings   Reproductive/Obstetrics negative OB ROS                             Anesthesia Physical Anesthesia Plan  ASA: 2  Anesthesia Plan: General   Post-op Pain Management: Minimal or no pain anticipated   Induction: Intravenous  PONV Risk Score and Plan: Propofol infusion  Airway Management Planned: Natural Airway and Nasal Cannula  Additional Equipment: None  Intra-op Plan:   Post-operative Plan:   Informed Consent: I have reviewed the patients History and Physical, chart, labs and discussed the procedure including the risks, benefits and alternatives for the proposed anesthesia with the patient or authorized representative who has indicated his/her understanding and acceptance.     Dental Advisory Given  Plan Discussed with: CRNA  Anesthesia Plan Comments:          Anesthesia Quick Evaluation

## 2023-03-05 NOTE — Anesthesia Procedure Notes (Signed)
 Procedure Name: LMA Insertion Date/Time: 03/05/2023 11:58 AM  Performed by: Cordella Elvie HERO, CRNAPre-anesthesia Checklist: Patient identified, Emergency Drugs available, Suction available, Patient being monitored and Timeout performed Patient Re-evaluated:Patient Re-evaluated prior to induction Oxygen Delivery Method: Circle system utilized Preoxygenation: Pre-oxygenation with 100% oxygen Induction Type: IV induction LMA: LMA inserted LMA Size: 4.0 Number of attempts: 1 Placement Confirmation: positive ETCO2, CO2 detector and breath sounds checked- equal and bilateral Tube secured with: Tape Dental Injury: Teeth and Oropharynx as per pre-operative assessment

## 2023-03-05 NOTE — Discharge Instructions (Addendum)
HOME INSTRUCTIONS  Please note any unusual or excessive bleeding, pain, swelling. Mild dizziness or drowsiness are normal for about 24 hours after surgery.   Shower when comfortable  Restrictions: No driving for 24 hours or while taking pain medications.  Activity:  No heavy lifting (> 10 lbs), nothing in vagina (no tampons, douching, or intercourse) x 1 week; no tub baths for 1 week Vaginal spotting is expected but if your bleeding is heavy and you are soaking through a pad in under 1-2 hours, please call the office   Diet:  You may return to your regular diet.  Do not eat large meals.  Eat small frequent meals throughout the day.  Continue to drink a good amount of water at least 6-8 glasses of water per day, hydration is very important for the healing process.  Pain Management: Take over the counter tylenol or ibuprofen as needed for pain.  You can either take one or alternate between the two medications for pain management.  You may also use a heating pack as needed.    Alcohol -- Avoid for 24 hours and while taking pain medications.  Nausea: Take sips of ginger ale or soda  Fever -- Call physician if temperature over 101 degrees  Follow up:  A postoperative follow up appointment is not indicated.  If you experience fever (a temperature greater than 100.4), pain unrelieved by pain medication, shortness of breath, swelling of a single leg, or any other symptoms which are concerning to you please the office immediately.  Should you have any concerns please don't hesitate to call the office- [phone]

## 2023-03-05 NOTE — Anesthesia Postprocedure Evaluation (Signed)
 Anesthesia Post Note  Patient: Margaret Bishop  Procedure(s) Performed: DILATATION AND CURETTAGE /HYSTEROSCOPY (Vagina )  Patient location during evaluation: PACU Anesthesia Type: General Level of consciousness: awake and alert Pain management: pain level controlled Vital Signs Assessment: post-procedure vital signs reviewed and stable Respiratory status: spontaneous breathing, nonlabored ventilation, respiratory function stable and patient connected to nasal cannula oxygen Cardiovascular status: blood pressure returned to baseline and stable Postop Assessment: no apparent nausea or vomiting Anesthetic complications: no   There were no known notable events for this encounter.   Last Vitals:  Vitals:   03/05/23 1315 03/05/23 1332  BP: 126/66 131/71  Pulse: 79 73  Resp: 20 19  Temp:  36.4 C  SpO2: 91% 97%    Last Pain:  Vitals:   03/05/23 1332  TempSrc: Oral  PainSc: 0-No pain                 Flossie Wexler L Ismahan Lippman

## 2023-03-05 NOTE — Transfer of Care (Signed)
 Immediate Anesthesia Transfer of Care Note  Patient: Margaret Bishop  Procedure(s) Performed: DILATATION AND CURETTAGE /HYSTEROSCOPY (Vagina )  Patient Location: PACU  Anesthesia Type:General  Level of Consciousness: awake, alert , oriented, and patient cooperative  Airway & Oxygen Therapy: Patient Spontanous Breathing and Patient connected to face mask oxygen  Post-op Assessment: Report given to RN, Post -op Vital signs reviewed and stable, and Patient moving all extremities X 4  Post vital signs: Reviewed and stable  Last Vitals:  Vitals Value Taken Time  BP 131/71 03/05/23 1233  Temp 37 C 03/05/23 1233  Pulse 85 03/05/23 1233  Resp 22 03/05/23 1233  SpO2 97 % 03/05/23 1233  Vitals shown include unfiled device data.  Last Pain:  Vitals:   03/05/23 0734  TempSrc: Oral  PainSc: 0-No pain         Complications: No notable events documented.

## 2023-03-05 NOTE — Op Note (Signed)
 Operative Report  PreOp: Postmenopausal bleeding, Thickened endometrium PostOp: same and uterine polyp (possible fibroid) Procedure:  Hysteroscopy, Dilation and Curettage, Endometrial ablation Surgeon: Dr. Delon Prude Anesthesia: General Complications:none EBL: Minimal IVF:500cc Discrepancy: 250cc  Findings: 6cm uterus with 3 large endometrial polyps and possible fibroid  Both ostia visualized.  Specimens: endometrial curettings with polyp (and possible fibroid)  Procedure: The patient was taken to the operating room where she underwent general anesthesia without difficulty. The patient was placed in a low lithotomy position using Allen stirrups. She was then prepped and draped in the normal sterile fashion. Sterile speculum was placed.  A single tooth tenaculum was placed on the anterior lip of the cervix. Cervical block was completed using 0.5% lidocaine  with epinephrine .  The uterus was then sounded to 6cm. The endocervical canal was then serially dilated to 14French using Hank dilators to accommodate the hysteroscopic apparatus.  The hysteroscope was inserted and findings were visualized as noted above.  The myosure Reach was used for resection of the endometrial masses.  Based on the appearance of the tissue suspect one may be a fibroid.  The tissue was sent to pathology.   The hysteroscope was removed under direct visualization.  All instrument were then removed. Hemostasis was observed at the cervical site. The patient was repositioned to the supine position. The patient tolerated the procedure without any complications and taken to recovery in stable condition.   Kevis Qu, DO Attending Obstetrician & Gynecologist, [facility] [place] for [employer], [facility]

## 2023-03-06 ENCOUNTER — Encounter (HOSPITAL_COMMUNITY): Payer: Self-pay | Admitting: Obstetrics & Gynecology

## 2023-03-07 ENCOUNTER — Encounter: Payer: Self-pay | Admitting: Obstetrics & Gynecology

## 2023-03-07 LAB — SURGICAL PATHOLOGY

## 2023-03-08 ENCOUNTER — Encounter (HOSPITAL_COMMUNITY): Payer: 59

## 2023-05-15 ENCOUNTER — Encounter: Payer: Self-pay | Admitting: Gastroenterology

## 2023-10-03 ENCOUNTER — Other Ambulatory Visit (HOSPITAL_COMMUNITY): Payer: Self-pay | Admitting: Adult Health

## 2023-10-03 DIAGNOSIS — Z13820 Encounter for screening for osteoporosis: Secondary | ICD-10-CM

## 2024-01-01 ENCOUNTER — Other Ambulatory Visit (HOSPITAL_COMMUNITY): Payer: Self-pay | Admitting: Adult Health

## 2024-01-01 DIAGNOSIS — Z1231 Encounter for screening mammogram for malignant neoplasm of breast: Secondary | ICD-10-CM

## 2024-01-03 ENCOUNTER — Other Ambulatory Visit (HOSPITAL_COMMUNITY)

## 2024-01-03 ENCOUNTER — Ambulatory Visit (HOSPITAL_COMMUNITY)

## 2024-01-09 ENCOUNTER — Encounter (HOSPITAL_COMMUNITY): Payer: Self-pay | Admitting: Adult Health

## 2024-01-13 ENCOUNTER — Other Ambulatory Visit (HOSPITAL_COMMUNITY): Payer: Self-pay | Admitting: Adult Health

## 2024-01-13 ENCOUNTER — Other Ambulatory Visit (HOSPITAL_COMMUNITY)

## 2024-01-13 DIAGNOSIS — N63 Unspecified lump in unspecified breast: Secondary | ICD-10-CM

## 2024-01-14 ENCOUNTER — Inpatient Hospital Stay (HOSPITAL_COMMUNITY): Admission: RE | Admit: 2024-01-14 | Discharge: 2024-01-14 | Attending: Adult Health

## 2024-01-14 DIAGNOSIS — Z780 Asymptomatic menopausal state: Secondary | ICD-10-CM | POA: Insufficient documentation

## 2024-01-14 DIAGNOSIS — Z13820 Encounter for screening for osteoporosis: Secondary | ICD-10-CM | POA: Insufficient documentation
